# Patient Record
Sex: Male | Born: 1945 | Race: White | Hispanic: No | Smoking: Former smoker
Health system: Western US, Academic
[De-identification: ages and names within clinical notes are randomized; demographics above are authoritative.]

## PROBLEM LIST (undated history)

## (undated) HISTORY — PX: COLONOSCOPY: GILAB00002

## (undated) HISTORY — PX: LAP UMBILICAL HERNIA REPAIR: S2076

## (undated) HISTORY — PX: THYROIDECTOMY, SUBTOTAL: SHX002597

## (undated) HISTORY — PX: REPAIR, HERNIA, INGUINAL, LAPAROSCOPIC: SHX001562

## (undated) HISTORY — PX: PR PARTIAL HIP REPLACEMENT: 27125

## (undated) HISTORY — PX: PR REMV CATARACT EXTRACAP,INSERT LENS,COMP: 66982

---

## 2014-11-19 ENCOUNTER — Ambulatory Visit: Payer: Medicare Other | Admitting: Internal Medicine

## 2014-11-19 VITALS — BP 130/90 | HR 68 | Temp 96.6°F | Ht 68.0 in | Wt 236.1 lb

## 2014-11-19 DIAGNOSIS — J301 Allergic rhinitis due to pollen: Secondary | ICD-10-CM

## 2014-11-19 DIAGNOSIS — R1310 Dysphagia, unspecified: Secondary | ICD-10-CM

## 2014-11-19 DIAGNOSIS — N400 Enlarged prostate without lower urinary tract symptoms: Secondary | ICD-10-CM | POA: Insufficient documentation

## 2014-11-19 DIAGNOSIS — C439 Malignant melanoma of skin, unspecified: Secondary | ICD-10-CM

## 2014-11-19 DIAGNOSIS — I10 Essential (primary) hypertension: Secondary | ICD-10-CM | POA: Insufficient documentation

## 2014-11-19 MED ORDER — LISINOPRIL 20 MG TABLET
20.0000 mg | ORAL_TABLET | Freq: Every day | ORAL | Status: DC
Start: 2014-11-19 — End: 2014-12-30

## 2014-11-19 NOTE — Nursing Note (Signed)
Patient identity confirmed using two identifiers.Vital signs taken, allergies verified, screened for pain.  Jmari Pelc, MA

## 2014-11-19 NOTE — Progress Notes (Signed)
Russell Freeman is a 69yr old male  Chief Complaint   Patient presents with    Establish Care    Other     swolling issue -food    Medication Follow Up     review     Here to establish.   He lives most of the year in [place] and had a full PE in February and will do a Screening Colonoscopy later this year.     He is here to follow up hypertension.  Hypertension ROS: taking medications as instructed, no medication side effects noted, patient does not perform home BP monitoring, no TIA's, no chest pain on exertion, no dyspnea on exertion, no swelling of ankles.   New concerns: none.     Having bad allergy symptoms. These include coryza, sneezing, nasal blockage and post nasal drip.  Has tried Ad[employer] account planner.  has not been tested for allergies.  Is allergic to pollen.     He complains of urinary symptoms - weak stream. Social history: no or minimal alcohol, nonsmoker. Past GU history is negative for BPH, UTI, stones or other renal diseases.     He complains of gastrointestinal symptoms of difficulty swallowing for 2 month. Feels like getting stuck mid chest region. No blood in stool. He denies watery diarrhea, abdominal pain, nausea, vomiting, anorexia, weight loss. Mainly solid food.   It does hurt to swallow especially when it gets stuck.  He denies constitutional symptoms of fatigue, weakness, weight loss or gain, fevers, night sweats. No unexplained weight loss. No heartburn or GERD symptoms.   Smoking history former smoker    Has some skin lesions that need to be checked.     History   Substance Use Topics    Smoking status: Not on file    Smokeless tobacco: Not on file    Alcohol Use: Not on file      OBJECTIVE:  BP 130/90 mmHg  Pulse 68  Temp(Src) 35.9 C (96.6 F) (Tympanic)  Ht 1.727 m (5\' 8" )  Wt 107.094 kg (236 lb 1.6 oz)  BMI 35.91 kg/m2  Appearance - in no apparent distress, well developed and well nourished, non-toxic, in no respiratory distress and acyanotic, alert, oriented times 3, well  groomed and dressed, normal vitals and well hydrated.  Chest Exam - Chest is clear, no wheezing, rhonchi or rales. Normal symmetric air entry throughout both lung fields. No chest wall deformities or tenderness.  Cardiac Exam - S1 and S2 normal, no murmurs, clicks, gallops or rubs. Regular rate and rhythm. Normal PMI. No edema or JVD.   Abdomen - soft without tenderness, guarding, mass, rebound or organomegaly. Bowel sounds are normal. No CVA tenderness or inguinal adenopathy noted.  Extremities - extremities, peripheral pulses and reflexes normal, no edema, redness or tenderness in the calves or thighs, no ulcers, gangrene or trophic changes, Homan's sign is negative, no sign of DVT, distal neurovascular exam intact.     IMPRESSION and PLAN:  (I10) Essential hypertension  (primary encounter diagnosis)  Comment: uncontrolled   Plan: Lisinopril (PRINIVIL, ZESTRIL) 20 mg Tablet,         DISCONTINUED: Lisinopril (PRINIVIL, ZESTRIL) 10        mg Tablet        Discussed sodium restriction, maintaining ideal body weight and regular exercise program as physiologic means to achieve blood pressure control. The patient will strive towards this. Meanwhile, it is appropriate to lower BP with medications, while observing for therapeutic effect and if appropriate  later, can discontinue medications if physiologic methods appear to be effective. The patient indicates understanding of these issues and agrees with the plan. The various types of antihypertensives are discussed fully. See medication orders in EpicCare. Side effects explained in detail. Continue home readings and see me for followup as scheduled. Low dose aspirin daily advised.      (N40.0) BPH (benign prostatic hyperplasia)  Comment: continue present medication(s)   Plan: Finasteride (PROSCAR) 5 mg Tablet, Tamsulosin         (FLOMAX) 0.4 mg Capsule        Call or return to clinic prn if these symptoms worsen or fail to improve as anticipated.     (J30.1) Allergic  rhinitis due to pollen  Comment: well controlled   Plan: Fluticasone (FLONASE) 50 mcg/actuation nasal         spray, Fexofenadine (ALLEGRA) 180 mg tablet        continue present medication(s) The causes and treatment of seasonal allergic rhinitis are discussed in detail. Allergen avoidance, use and side effects of OTC and prescription antihistamine decongestant products, and the use and side effects of inhaled nasal corticosteroids is reviewed. Allergy desensitization shots are reserved for severe and refractory cases.     (R13.10) Dysphagia  Comment: Needs work up   Plan: [place], [place]        Call or return to clinic prn if these symptoms worsen or fail to improve as anticipated. The patient is asked to make an attempt to improve diet and exercise patterns to aid in medical management of this problem.     (C43.9) Melanoma of skin  Comment: history and needs some moles checked.   Plan: DERMATOLOGY CLINIC REFERRAL          FOLLOW UP 1 month  Total time of visit 40 minutes greater than 50% of time was spent counseling about:  Essential hypertension  (primary encounter diagnosis)  BPH (benign prostatic hyperplasia)  Allergic rhinitis due to pollen  Dysphagia  Melanoma of skin.   The patient verbalizes understanding and agrees with the plan of care.   Patient does not have access to My Chart.  History reviewed and updated.  Barriers to Learning assessed: none. Patient verbalizes understanding of teaching and instructions. An after visit summary was provided to the patient. The patient was instructed in all aspects of care and expressed comprehension.   Education given to the patient regarding diagnosis and follow-up.     Electronically signed by:  Bethena Midget, M.D.  [place]

## 2014-11-24 ENCOUNTER — Ambulatory Visit
Admission: RE | Admit: 2014-11-24 | Discharge: 2014-11-24 | Disposition: A | Discharge status: Home / Self Care | Payer: Medicare Other | Source: Ambulatory Visit | Attending: Internal Medicine | Admitting: Internal Medicine

## 2014-11-24 DIAGNOSIS — R1310 Dysphagia, unspecified: Principal | ICD-10-CM | POA: Insufficient documentation

## 2014-11-24 DIAGNOSIS — K219 Gastro-esophageal reflux disease without esophagitis: Secondary | ICD-10-CM

## 2014-11-24 DIAGNOSIS — K449 Diaphragmatic hernia without obstruction or gangrene: Secondary | ICD-10-CM

## 2014-11-24 MED ORDER — BARIUM SULFATE 98 % ORAL POWDER FOR SUSPENSION
60.0000 mL | INHALATION_SUSPENSION | ORAL | Status: AC
Start: 2014-11-24 — End: 2014-11-24
  Administered 2014-11-24: 60 mL via ORAL

## 2014-11-24 MED ORDER — BARIUM SULFATE 40 % (W/V), 30 % (W/W) ORAL SUSPENSION
60.0000 mL | ORAL | Status: AC
Start: 2014-11-24 — End: 2014-11-24
  Administered 2014-11-24: 60 mL via ORAL

## 2014-12-04 ENCOUNTER — Telehealth: Payer: Self-pay | Admitting: Internal Medicine

## 2014-12-04 MED ORDER — PANTOPRAZOLE 40 MG TABLET,DELAYED RELEASE
40.0000 mg | DELAYED_RELEASE_TABLET | Freq: Every day | ORAL | Status: DC
Start: 2014-12-04 — End: 2014-12-30

## 2014-12-04 NOTE — Telephone Encounter (Signed)
Spoke with patient. The GI clinic is unable to schedule him in til June 9th. He is going back [place] on June 8 and seeing Dr. Molly Maduro, gastroenterologist (pt seen this doctor in the past).

## 2014-12-04 NOTE — Telephone Encounter (Signed)
It showed he has acid reflux  I am going to send in some medication for this once a day  He can schedule with GI Medicine  Thanks

## 2014-12-04 NOTE — Telephone Encounter (Signed)
Patient is calling for the results of the esophogram done 11/24/14.     He is also calling the Gastroenterology clinic to schedule his appointment there.     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

## 2014-12-17 ENCOUNTER — Ambulatory Visit: Payer: Medicare Other | Admitting: Internal Medicine

## 2014-12-17 ENCOUNTER — Encounter: Payer: Self-pay | Admitting: Internal Medicine

## 2014-12-17 VITALS — BP 130/88 | HR 72 | Temp 96.6°F | Wt 243.7 lb

## 2014-12-17 DIAGNOSIS — I10 Essential (primary) hypertension: Principal | ICD-10-CM

## 2014-12-17 DIAGNOSIS — K219 Gastro-esophageal reflux disease without esophagitis: Secondary | ICD-10-CM | POA: Insufficient documentation

## 2014-12-17 DIAGNOSIS — R635 Abnormal weight gain: Secondary | ICD-10-CM

## 2014-12-17 MED ORDER — AMLODIPINE 5 MG TABLET
5.0000 mg | ORAL_TABLET | Freq: Every day | ORAL | Status: DC
Start: 2014-12-17 — End: 2015-01-22

## 2014-12-17 NOTE — Nursing Note (Signed)
Patient identity confirmed using two identifiers.Vital signs taken, allergies verified, screened for pain.  Menna Abeln, MA

## 2014-12-17 NOTE — Progress Notes (Signed)
Russell Freeman is a 69yr old male    Chief Complaint   Patient presents with    Blood Pressure     Patient Active Problem List   Diagnosis    BPH (benign prostatic hyperplasia)    Essential hypertension      He is here to follow up hypertension.  Hypertension ROS: taking medications as instructed, no medication side effects noted, home BP monitoring in range of 960'A systolic over 54'U diastolic, no TIA's, no chest pain on exertion, no dyspnea on exertion, no swelling of ankles.   New concerns: none. Feels no change 133/104 this morning.      \Continues to gain weight.   He is here to discuss weight gain. He has gained 20 pounds over 1-2 years year(s). Has had thyroid function studies. Feels weight gain is related to lack of activity and knee pain.       He complains of gastrointestinal symptoms of difficulty swallowing for 2 month. Feels like getting stuck mid chest region. No blood in stool. He denies watery diarrhea, abdominal pain, nausea, vomiting, anorexia, weight loss. Mainly solid food.   It does hurt to swallow especially when it gets stuck.  He denies constitutional symptoms of fatigue, weakness, weight loss or gain, fevers, night sweats. No unexplained weight loss. No heartburn or GERD symptoms.   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    IMPRESSION:  1. SMALL HIATAL HERNIA.  2. GASTROESOPHAGEAL REFLUX.    History   Substance Use Topics    Smoking status: Never Smoker     Smokeless tobacco: Not on file    Alcohol Use: No       OBJECTIVE:  BP 130/88 mmHg  Pulse 72  Temp(Src) 35.9 C (96.6 F) (Tympanic)  Wt 110.542 kg (243 lb 11.2 oz)  Appearance - in no apparent distress, well developed and well nourished, non-toxic, in no respiratory distress and acyanotic, alert, oriented times 3, well groomed and dressed, normal vitals and well hydrated.   Mental status exam; he is alert, orient to time, person and place. Normal thought content, speech, affect, mood and dress  are noted.   Extremities - extremities, peripheral pulses and reflexes normal, no edema, redness or tenderness in the calves or thighs, no ulcers, gangrene or trophic changes, Homan's sign is negative, no sign of DVT, distal neurovascular exam intact.  Stasis dermatitis    IMPRESSION and PLAN:  (I10) Essential hypertension  (primary encounter diagnosis)  Comment: borderline control   Plan: LIPID PANEL WITH DLDL REFLEX, COMPREHENSIVE         METABOLIC PANEL, CBC NO DIFFERENTIAL,         URINALYSIS AND CULTURE IF IND, Amlodipine         (NORVASC) 5 mg Tablet        Increase Lisinopril to 40 mg if BP still running high in 2 weeks add Norvasc Discussed sodium restriction, maintaining ideal body weight and regular exercise program as physiologic means to achieve blood pressure control. The patient will strive towards this. Meanwhile, it is appropriate to lower BP with medications, while observing for therapeutic effect and if appropriate later, can discontinue medications if physiologic methods appear to be effective. The patient indicates understanding of these issues and agrees with the plan. The various types of antihypertensives are discussed fully. See medication orders in EpicCare. Side effects explained in detail. Continue home readings and see me for followup as scheduled. Low dose aspirin daily advised.      (  K21.9) Gastroesophageal reflux disease without esophagitis  Comment: well controlled   Plan: continue present medication(s) Call or return to clinic prn if these symptoms worsen or fail to improve as anticipated.     (R63.5) Weight gain  Comment: I had a lengthy discussion with the patient regarding the various weight loss programs, including our classes, dietician, Weight Watchers, [facility] and others and the commitment and desire necessary for success. The patient will consider these options. We also discussed the importane of cardio vascular exercise in achieving weight loss goals.     FOLLOW UP 1  month  Total time of visit 25 minutes greater than 50% of time was spent counseling about:  Essential hypertension  (primary encounter diagnosis)  Gastroesophageal reflux disease without esophagitis  Weight gain.   The patient verbalizes understanding and agrees with the plan of care.   Patient does not have access to My Chart.  History reviewed and updated.  Barriers to Learning assessed: none. Patient verbalizes understanding of teaching and instructions. An after visit summary was provided to the patient. The patient was instructed in all aspects of care and expressed comprehension.   Education given to the patient regarding diagnosis and follow-up.     Electronically signed by:  Bethena Midget, M.D.  [place]

## 2014-12-17 NOTE — Patient Instructions (Signed)
Increase Lisinopril to 40 mg daily.  If BP still high after 2 weeks then add Amlodipine.

## 2014-12-19 ENCOUNTER — Ambulatory Visit: Payer: Medicare Other | Admitting: Internal Medicine

## 2014-12-19 VITALS — BP 110/78 | HR 96 | Temp 97.5°F | Wt 244.4 lb

## 2014-12-19 DIAGNOSIS — J208 Acute bronchitis due to other specified organisms: Principal | ICD-10-CM

## 2014-12-19 MED ORDER — AZITHROMYCIN 250 MG TABLET
ORAL_TABLET | ORAL | Status: AC
Start: 2014-12-19 — End: 2014-12-24

## 2014-12-19 MED ORDER — CODEINE 10 MG-GUAIFENESIN 100 MG/5 ML ORAL LIQUID
10.0000 mL | ORAL | Status: AC | PRN
Start: 2014-12-19 — End: 2015-01-18

## 2014-12-19 NOTE — Nursing Note (Signed)
Patient identity confirmed using two identifiers.Vital signs taken, allergies verified, screened for pain.  Lark Runk, MA

## 2014-12-19 NOTE — Progress Notes (Signed)
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 Now the last two days body aches and sore throat. Chest congestion.   He complains of sinus and nasal congestion, sore throat, nasal blockage, post nasal drip, chest congestion, productive cough and cough described as productive of yellow and green sputum for several days. Denies a history of sweats, chills, fevers, myalgias, wheezing, shortness of breath, chest pain, weakness, fatigue, dizziness, nausea and vomiting. Denies a history of asthma. He patient does not smoke cigarettes.   OTC Medications tried: none    History   Substance Use Topics    Smoking status: Never Smoker     Smokeless tobacco: Not on file    Alcohol Use: No         OBJECTIVE:  BP 110/78 mmHg  Pulse 96  Temp(Src) 36.4 C (97.5 F) (Tympanic)  Wt 110.859 kg (244 lb 6.4 oz)  SpO2 93%   Appearance - in no apparent distress, well developed and well nourished, non-toxic, in no respiratory distress and acyanotic, alert, oriented times 3, well groomed and dressed, normal vitals and well hydrated.  Ear exam - both sides normal, TM intact without perforation or effusion, external canal normal. No significant cerumenosis noted.  Nasal exam - septum midline, no deformities, nares patent, normal mucosa without swelling, no polyps, no bleeding.  Throat - Oral cavity, tongue, pharynx and palate have no inflammation or suspicious lesions. No erythema or exudates. Uvula midline. Tonsils - tonsils are present and normal.  Sinuses - Paranasal sinuses are normal. No tenderness to the maxillary, frontal, ethmoids or mastoids.  Neck - neck  is supple and free of adenopathy or masses, the thyroid is normal without enlargement or nodules. No JVD. Carotids 2+ without bruits.   Chest Exam - Chest is clear, no wheezing, rhonchi or rales. Normal symmetric air entry throughout both lung fields. No chest wall deformities or tenderness.    IMPRESSION and PLAN:  (J20.9) Acute bronchitis  (primary encounter diagnosis)  Comment: bronchitis  Symptomatic therapy suggested: push fluids, rest and ROV prn if symptoms persist or worsen. Call or return to clinic prn if these symptoms worsen or fail to improve as anticipated.  To the ER with any increased cough, chest pain, dyspnea, wheezing or hemoptysis. Or fever, chills or night sweats.  Allergies to antibiotics were reviewed. The common side-effects of antibiotics were discussed. GI symptoms can occur. Watch for rash or hives or sign off allergic reaction and discontinue and seek medical care immediately.  Chest xray if no better in 2-3 days.   Plan: antibiotics as ordered          FOLLOW UP prn  Total time spent with the patient 25 minutes.  History reviewed and updated.  Barriers to Learning assessed: none. Patient verbalizes understanding of teaching and instructions. An after visit summary was provided to the patient. The patient was instructed in all aspects of care and expressed comprehension.   Education given to the patient regarding diagnosis and  follow-up.     Electronically signed by:  Bethena Midget, M.D.  [place]

## 2014-12-30 ENCOUNTER — Telehealth: Payer: Self-pay | Admitting: Internal Medicine

## 2014-12-30 MED ORDER — PANTOPRAZOLE 40 MG TABLET,DELAYED RELEASE
40.0000 mg | DELAYED_RELEASE_TABLET | Freq: Every day | ORAL | Status: DC
Start: 2014-12-30 — End: 2016-03-07

## 2014-12-30 MED ORDER — LISINOPRIL 20 MG TABLET
20.0000 mg | ORAL_TABLET | Freq: Two times a day (BID) | ORAL | Status: DC
Start: 2014-12-30 — End: 2015-09-14

## 2014-12-30 NOTE — Telephone Encounter (Signed)
Received refill fax from [facility] regarding Lisinopril 20mg  tablet stating attn: Dr. Redmond Pulling, pt states he is supposed to be on 40mg . Can we change? Also pt request 90days on all his med, can we get in 90 days?    On Pantoprazole Sod DR 40mg  tab states can we get this in 90 days per insurance?   Please advise.

## 2014-12-30 NOTE — Telephone Encounter (Signed)
Done

## 2015-01-22 ENCOUNTER — Ambulatory Visit: Payer: Medicare Other | Admitting: Internal Medicine

## 2015-01-22 VITALS — BP 130/88 | HR 72 | Temp 96.4°F | Wt 243.5 lb

## 2015-01-22 DIAGNOSIS — I10 Essential (primary) hypertension: Principal | ICD-10-CM

## 2015-01-22 NOTE — Nursing Note (Signed)
Patient identity confirmed using two identifiers.Vital signs taken, allergies verified, screened for pain.  Gianina Olinde, MA

## 2015-01-22 NOTE — Progress Notes (Signed)
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  Hypertension ROS: taking medications as instructed, no medication side effects noted, home BP monitoring in range of 469-629'B systolic over 28-41'L diastolic, no TIA's, no chest pain on exertion, no dyspnea on exertion, no swelling of ankles.   New concerns: he made some changes in his diet. He feels the increase from 20 mg to 40 mg is too much. He was fine on the 20 mg. Never took the Amlodipine.      He hasn't been taking his BP medication and feels it was making his dizzy a liitle bit when he stands up too fast. Light headed and fatigued.     History   Substance Use Topics    Smoking status: Never Smoker     Smokeless tobacco: Not on file    Alcohol Use: No      OBJECTIVE:  BP 130/88 mmHg  Pulse 72  Temp(Src) 35.8 C (96.4 F) (Tympanic)  Wt 110.451 kg (243 lb 8 oz)  Appearance - in no apparent distress, well developed and well nourished, non-toxic, in no respiratory distress and acyanotic, alert, oriented times 3, well groomed and dressed, normal vitals and well hydrated.  Mental status exam; he is alert, orient to time, person and place. Normal thought content, speech, affect, mood and dress are noted.   Cardiac Exam - S1 and S2 normal, no murmurs, clicks, gallops or rubs. Regular rate and rhythm. Normal PMI. No edema or JVD.     IMPRESSION and PLAN:  (I10) Essential hypertension  (primary encounter diagnosis)  Comment: low readings at home. Decrease back down to 20 mg and monitor BP.   Plan: Discussed sodium restriction, maintaining ideal body weight and regular exercise program as physiologic means to achieve blood pressure control. The patient will strive towards this. Meanwhile, it is  appropriate to lower BP with medications, while observing for therapeutic effect and if appropriate later, can discontinue medications if physiologic methods appear to be effective. The patient indicates understanding of these issues and agrees with the plan. The various types of antihypertensives are discussed fully. See medication orders in EpicCare. Side effects explained in detail. Continue home readings and see me for followup as scheduled. Low dose aspirin daily advised.      FOLLOW UP 1 month  Total time of visit 15 minutes greater than 50% of time was spent counseling about:  Essential hypertension  (primary encounter diagnosis).   The patient verbalizes understanding and agrees with the plan of care.   Patient does not have access to My Chart.  History reviewed and updated.  Barriers to Learning assessed: none. Patient verbalizes understanding of teaching and instructions. An after visit summary was provided to the patient. The patient was instructed in all aspects of care and expressed comprehension.   Education given to the patient regarding diagnosis and follow-up.     Electronically signed by:  Bethena Midget, M.D.  [place]

## 2015-07-01 ENCOUNTER — Ambulatory Visit: Payer: Medicare Other

## 2015-07-01 DIAGNOSIS — Z23 Encounter for immunization: Principal | ICD-10-CM

## 2015-07-01 NOTE — Nursing Note (Signed)
The Influenza Vaccine VIS document for the flu injection was given to patient to review. Patient or person named in permission has answered no to any history of egg allergy, previous serious reaction to a influenza vaccine or current illness which would preclude them receiving an immunization. Any questions were referred to the physician. The Influenza Vaccine was then administered per protocol. The patient was observed for immediate reactions to the vaccine per protocol. None were observed.   Matthew Cina, lvn

## 2015-09-13 ENCOUNTER — Other Ambulatory Visit: Payer: Self-pay | Admitting: Internal Medicine

## 2015-09-13 DIAGNOSIS — J208 Acute bronchitis due to other specified organisms: Principal | ICD-10-CM

## 2015-09-14 ENCOUNTER — Ambulatory Visit: Payer: Medicare Other | Admitting: Internal Medicine

## 2015-09-14 ENCOUNTER — Encounter: Payer: Self-pay | Admitting: Internal Medicine

## 2015-09-14 VITALS — BP 114/74 | HR 94 | Temp 97.8°F | Wt 249.3 lb

## 2015-09-14 DIAGNOSIS — J00 Acute nasopharyngitis [common cold]: Principal | ICD-10-CM

## 2015-09-14 DIAGNOSIS — I10 Essential (primary) hypertension: Secondary | ICD-10-CM

## 2015-09-14 MED ORDER — LISINOPRIL 10 MG TABLET
10.0000 mg | ORAL_TABLET | Freq: Two times a day (BID) | ORAL | 5 refills | Status: DC
Start: 2015-09-14 — End: 2015-09-14

## 2015-09-14 MED ORDER — CODEINE 10 MG-GUAIFENESIN 100 MG/5 ML ORAL LIQUID
5.0000 mL | ORAL | 0 refills | Status: DC | PRN
Start: 2015-09-14 — End: 2016-10-03

## 2015-09-14 MED ORDER — LISINOPRIL 10 MG TABLET
10.0000 mg | ORAL_TABLET | Freq: Two times a day (BID) | ORAL | 3 refills | Status: DC
Start: 2015-09-14 — End: 2016-03-30

## 2015-09-14 NOTE — Progress Notes (Signed)
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   He complains of sinus and nasal congestion, sore throat, nasal blockage, post nasal drip, chest congestion, productive cough and cough described as productive of clear sputum for several days. Denies a history of sweats, chills, fevers, myalgias, wheezing, shortness of breath, chest pain, weakness, fatigue, dizziness, nausea and vomiting. Denies a history of asthma. He patient does not smoke cigarettes.   OTC Medications tried: Allegra    He is here to follow up hypertension.  Hypertension ROS: taking medications as instructed, no medication side effects noted, home BP monitoring in range of [id number] systolic over [id number] diastolic, no TIA's, no chest pain on exertion, no dyspnea on exertion, no swelling of ankles.   New concerns: none. He is taking half a Lisinopril bid    Social History   Substance Use Topics    Smoking status: Never Smoker    Smokeless tobacco: Not on file    Alcohol use No         OBJECTIVE:  BP 114/74  Pulse 94  Temp 36.6 C (97.8 F) (Temporal)  Wt 113.1 kg (249 lb 5.4 oz)  SpO2 95%  BMI 37.91 kg/m2   Appearance - in no apparent distress, well developed and well nourished, non-toxic, in no respiratory distress and acyanotic, alert, oriented times 3, well groomed and dressed, normal vitals and well hydrated.  Ear exam - both sides normal, TM intact without perforation or effusion, external canal normal. No significant cerumenosis noted.  Nasal exam - septum midline, no deformities, nares patent, normal mucosa without swelling, no polyps, no bleeding.  Throat - Oral cavity, tongue, pharynx  and palate have no inflammation or suspicious lesions. No erythema or exudates. Uvula midline. Tonsils - tonsils are present and normal.  Sinuses - Paranasal sinuses are normal. No tenderness to the maxillary, frontal, ethmoids or mastoids.  Neck - neck is supple and free of adenopathy or masses, the thyroid is normal without enlargement or nodules. No JVD. Carotids 2+ without bruits.   Chest Exam - Chest is clear, no wheezing, rhonchi or rales. Normal symmetric air entry throughout both lung fields. No chest wall deformities or tenderness.    IMPRESSION and PLAN:  (J20.9) Acute bronchitis  (primary encounter diagnosis)  Comment: bronchitis  Symptomatic therapy suggested: push fluids, rest and ROV prn if symptoms persist or worsen. Call or return to clinic prn if these symptoms worsen or fail to improve as anticipated.  To the ER with any increased cough, chest pain, dyspnea, wheezing or hemoptysis. Or fever, chills or night sweats.  Chest xray if no better in 2-3 days.     (I10) Essential hypertension  Comment: well controlled   Plan: Discussed sodium restriction, maintaining ideal body weight and regular exercise program as physiologic means to achieve blood pressure control. The patient will strive towards this. Meanwhile, it is appropriate to lower BP with medications, while observing for therapeutic effect and if appropriate later,  can discontinue medications if physiologic methods appear to be effective. The patient indicates understanding of these issues and agrees with the plan. The various types of antihypertensives are discussed fully. See medication orders in EpicCare. Side effects explained in detail. Continue home readings and see me for followup as scheduled. Low dose aspirin daily advised.  He had labs last June in [place] and everything OK    FOLLOW UP 6 months  Total time spent with the patient 25 minutes.  History reviewed and updated.  Barriers to Learning assessed: none. Patient verbalizes  understanding of teaching and instructions. An after visit summary was provided to the patient. The patient was instructed in all aspects of care and expressed comprehension.   Education given to the patient regarding diagnosis and follow-up.     Electronically signed by:  Bethena Midget, M.D.  [place]

## 2015-09-14 NOTE — Nursing Note (Signed)
Patient identity confirmed using two identifiers.Vital signs taken, allergies verified, screened for pain.  Paralee Pendergrass, MA

## 2016-03-07 ENCOUNTER — Encounter: Payer: Self-pay | Admitting: Internal Medicine

## 2016-03-07 MED ORDER — FINASTERIDE 5 MG TABLET
5.0000 mg | ORAL_TABLET | Freq: Every day | ORAL | 2 refills | Status: DC
Start: 2016-03-07 — End: 2016-11-02

## 2016-03-07 MED ORDER — PANTOPRAZOLE 40 MG TABLET,DELAYED RELEASE
40.0000 mg | DELAYED_RELEASE_TABLET | Freq: Every day | ORAL | 2 refills | Status: DC
Start: 2016-03-07 — End: 2016-11-24

## 2016-03-07 MED ORDER — TAMSULOSIN 0.4 MG CAPSULE
0.4000 mg | ORAL_CAPSULE | Freq: Every day | ORAL | 2 refills | Status: DC
Start: 2016-03-07 — End: 2016-04-11

## 2016-03-07 NOTE — Telephone Encounter (Signed)
From: Elwyn Reach  To: Tito Dine, MD  Sent: 03/05/2016 11:33 AM PDT  Subject: MyChart Refill Request    I need a refill on Pantoprazole sod dr 40 mg tab. Dr. Redmond Pulling wrote the perscription on 12/30/2014. Iget 90 pills. Also on my tamulosin and finesteride I am supposed to get 90 pills each, but on my chart it still has only 30 pills. Thank you

## 2016-03-30 ENCOUNTER — Encounter: Payer: Self-pay | Admitting: Internal Medicine

## 2016-03-30 ENCOUNTER — Ambulatory Visit
Admission: RE | Admit: 2016-03-30 | Discharge: 2016-03-30 | Disposition: A | Discharge status: Home / Self Care | Payer: Medicare Other | Source: Ambulatory Visit | Attending: Internal Medicine | Admitting: Internal Medicine

## 2016-03-30 ENCOUNTER — Ambulatory Visit (INDEPENDENT_AMBULATORY_CARE_PROVIDER_SITE_OTHER): Payer: Medicare Other | Admitting: Internal Medicine

## 2016-03-30 ENCOUNTER — Other Ambulatory Visit: Payer: Self-pay | Admitting: Internal Medicine

## 2016-03-30 VITALS — BP 134/82 | HR 64 | Temp 96.6°F | Wt 252.0 lb

## 2016-03-30 DIAGNOSIS — M25562 Pain in left knee: Principal | ICD-10-CM

## 2016-03-30 DIAGNOSIS — I10 Essential (primary) hypertension: Principal | ICD-10-CM

## 2016-03-30 DIAGNOSIS — Z125 Encounter for screening for malignant neoplasm of prostate: Secondary | ICD-10-CM

## 2016-03-30 DIAGNOSIS — N400 Enlarged prostate without lower urinary tract symptoms: Secondary | ICD-10-CM

## 2016-03-30 DIAGNOSIS — K635 Polyp of colon: Secondary | ICD-10-CM

## 2016-03-30 DIAGNOSIS — M1712 Unilateral primary osteoarthritis, left knee: Secondary | ICD-10-CM

## 2016-03-30 NOTE — Nursing Note (Signed)
Patient identity confirmed using two identifiers.Vital signs taken, allergies verified, screened for pain.  Alixandria Friedt, MA

## 2016-03-30 NOTE — Patient Instructions (Signed)
Cut Lisinopril in half - One a day due to low Blood Pressure    Trial cut the Pantoprazole in half to see if that controls symptoms     Labs fasting     Schedule with   [facility]   Phone: [phone]  For your knee - xray today    Schedule with Urologist to check prostate and discuss medications  Dr. Richardine Service  Phone: [phone]

## 2016-03-30 NOTE — Progress Notes (Signed)
SUBJECTIVE:  Russell Freeman is a 70yr old male    Chief Complaint: Patient presents today with Adult Well Visit (Medicare) (?); Referral (colonoscopy); Medication Problem (BP medication); and Knee Problem (left)    Patient Active Problem List   Diagnosis    BPH (benign prostatic hyperplasia)    Essential hypertension    Gastroesophageal reflux disease without esophagitis      Current Outpatient Prescriptions on File Prior to Visit   Medication Sig Dispense Refill    Fexofenadine (ALLEGRA) 180 mg tablet Take 1 tablet by mouth every day. 30 tablet 6    Finasteride (PROSCAR) 5 mg Tablet Take 1 tablet by mouth every day. (prostate) 90 tablet 2    Fluticasone (FLONASE) 50 mcg/actuation nasal spray Instill 1-2 sprays into EACH nostril every day. (allergies) 16 g 5    Lisinopril (PRINIVIL, ZESTRIL) 10 mg Tablet Take 1 tablet by mouth 2 times daily. 180 tablet 3    Pantoprazole (PROTONIX) 40 mg Delayed Release Tablet Take 1 tablet by mouth every day. 90 tablet 2    Tamsulosin (FLOMAX) 0.4 mg Capsule Take 1 capsule by mouth every day at bedtime. (prostate) 90 capsule 2     No current facility-administered medications on file prior to visit.      He is here to follow up hypertension.  Hypertension ROS: taking medications as instructed, no medication side effects noted, no TIA's, no chest pain on exertion, no dyspnea on exertion, no swelling of ankles, does note some dizziness when arising.   New concerns: feels BP drops too low at times.     He complains of knees pain left side for several week(s).  Pain is described as aching. It is worse with certain activities especially walking. The patient has tried nothing for the pain. Symptoms have been gradually worsening over time.     He complains of urinary symptoms - urinary hesitancy for several years, gradual, worsening over time. ROS: patient denies dysuria, hematuria. Social history: no or minimal alcohol, nonsmoker, no or mild caffeine use, no ASA or NSAID's. Past GU  history is negative for BPH, UTI, stones or other renal diseases.     Family History:  his family history includes Hypertension in his father and mother.    Social History:  He  reports that he has never smoked. He has never used smokeless tobacco. He reports that he does not drink alcohol.     OBJECTIVE:   BP 134/82  Pulse 64  Temp (!) 35.9 C (96.6 F) (Temporal)  Wt 114.3 kg (251 lb 15.8 oz)  BMI 38.31 kg/m2   Appearance - in no apparent distress, well developed and well nourished, non-toxic, in no respiratory distress and acyanotic, alert, oriented times 3, well groomed and dressed, normal vitals and well hydrated.  Knee exam: left positive for moderate crepitations, some mild tenderness and pain on range of motion, no effusion is present, no pseudolaxity noted.   Mental status exam; he is alert, orient to time, person and place. Normal thought content, speech, affect, mood and dress are noted.     IMPRESSION and PLAN:  (I10) Essential hypertension  (primary encounter diagnosis)  Comment: well controlled   Plan: Lisinopril (PRINIVIL, ZESTRIL) 10 mg Tablet,         LIPID PANEL WITH DLDL REFLEX, COMPREHENSIVE         METABOLIC PANEL, CBC NO DIFFERENTIAL,         URINALYSIS AND CULTURE IF IND  Discussed sodium restriction, maintaining ideal body weight and regular exercise program as physiologic means to achieve blood pressure control. The patient will strive towards this. Meanwhile, it is appropriate to lower BP with medications, while observing for therapeutic effect and if appropriate later, can discontinue medications if physiologic methods appear to be effective. The patient indicates understanding of these issues and agrees with the plan. The various types of antihypertensives are discussed fully. See medication orders in EpicCare. Side effects explained in detail. Continue home readings and see me for followup as scheduled. Low dose aspirin daily advised.      (K63.5) Colon polyps  Comment:  due  Plan: GASTROENTEROLOGY REFERRAL          (M25.562) Acute pain of left knee  Comment: suspect OA   Plan: CANCELED: KNEE 3 VIEWS, LEFT        Suspect this is a flare up of arthritis due to degenerative changes and overuse/repetitive movements over the joint.   He was advised to     Take anti-inflammatory medicine, such as ibuprofen or Aleve. Advised that nonsteroidal anti-inflammatory medicines (NSAIDs) may cause stomach bleeding and other problems. These risks increase with age. Read the label and take as directed.   When the affected area, is painful, put an ice pack, gel pack, or package of frozen peas, wrapped in a cloth, on the area every 3 to 4 hours for up to 20 minutes at a time.    Apply moist heat to help a regain range of motion.    Do the exercises prescribed to keep the joint moving.    Get physical therapy if the above means don't improve the condition   Avoid activities that make the problem worse.   Consider Glucosamine supplements.   Will consider specialist evaluation or cortisone in one month if no improvement. Follow up one month.      (N40.0) Benign prostatic hyperplasia, presence of lower urinary tract symptoms unspecified, unspecified morphology  Comment: due  Plan: PSA SCREEN, [place] he see Urology     (Z12.5) Screening for prostate cancer  Comment: due  Plan: PSA SCREEN          FOLLOW UP 3 months  Total time of visit 30 minutes greater than 50% of time was spent counseling about:  Essential hypertension  (primary encounter diagnosis)  Colon polyps  Acute pain of left knee  Benign prostatic hyperplasia, presence of lower urinary tract symptoms unspecified, unspecified morphology  Screening for prostate cancer.   The patient verbalizes understanding and agrees with the plan of care.   Patient does have access to My Chart.  History reviewed and updated.  Barriers to Learning assessed: none. Patient verbalizes understanding of teaching and instructions.  An after visit summary was provided to the patient. The patient was instructed in all aspects of care and expressed comprehension.   Education given to the patient regarding diagnosis and follow-up.     Electronically signed by:  Bethena Midget, M.D.  [place]

## 2016-03-31 ENCOUNTER — Telehealth: Payer: Self-pay | Admitting: Gastroenterology

## 2016-04-01 ENCOUNTER — Ambulatory Visit: Payer: Medicare Other | Attending: Internal Medicine

## 2016-04-01 DIAGNOSIS — Z125 Encounter for screening for malignant neoplasm of prostate: Secondary | ICD-10-CM | POA: Insufficient documentation

## 2016-04-01 DIAGNOSIS — I10 Essential (primary) hypertension: Principal | ICD-10-CM | POA: Insufficient documentation

## 2016-04-01 DIAGNOSIS — N400 Enlarged prostate without lower urinary tract symptoms: Secondary | ICD-10-CM | POA: Insufficient documentation

## 2016-04-01 LAB — COMPREHENSIVE METABOLIC PANEL
ALANINE TRANSFERASE (ALT): 25 U/L (ref 6–63)
ALBUMIN: 3.9 g/dL (ref 3.2–4.6)
ALKALINE PHOSPHATASE (ALP): 84 U/L (ref 35–115)
ASPARTATE TRANSAMINASE (AST): 22 U/L (ref 15–43)
Bilirubin Total: 1.2 mg/dL (ref 0.3–1.3)
CALCIUM: 9.2 mg/dL (ref 8.6–10.5)
CARBON DIOXIDE TOTAL: 23 mmol/L — AB (ref 24–32)
CHLORIDE: 106 mmol/L (ref 95–110)
Creatinine Serum: 1.33 mg/dL — ABNORMAL HIGH (ref 0.44–1.27)
E-GFR, NON-AFRICAN AMERICAN: 55 mL/min/{1.73_m2}
GLUCOSE: 99 mg/dL (ref 70–99)
POTASSIUM: 4.4 mmol/L (ref 3.3–5.0)
PROTEIN: 6.4 g/dL (ref 6.3–8.3)
SODIUM: 138 mmol/L (ref 135–145)
UREA NITROGEN, BLOOD (BUN): 18 mg/dL (ref 8–22)

## 2016-04-01 LAB — CBC NO DIFFERENTIAL
HEMATOCRIT: 48.4 % (ref 41.0–53.0)
HEMOGLOBIN: 16.3 g/dL (ref 13.5–17.5)
MCH: 30.8 pg (ref 27.0–33.0)
MCHC: 33.7 % (ref 32.0–36.0)
MCV: 91.4 UM3 (ref 80.0–100.0)
MPV: 8.4 UM3 (ref 6.8–10.0)
PLATELET COUNT: 206 10*3/uL (ref 130–400)
RDW: 13.4 U (ref 0.0–14.7)
RED CELL COUNT: 5.29 10*6/uL (ref 4.50–5.90)
WHITE BLOOD CELL COUNT: 6.4 10*3/uL (ref 4.5–11.0)

## 2016-04-01 LAB — URINALYSIS AND CULTURE IF IND
BILIRUBIN URINE: NEGATIVE
GLUCOSE URINE: NEGATIVE mg/dL
KETONES: NEGATIVE mg/dL
LEUK. ESTERASE: NEGATIVE
NITRITE URINE: NEGATIVE
PH URINE: 6 (ref 4.8–7.8)
PROTEIN URINE: NEGATIVE mg/dL
RBC: 2 /[HPF] (ref 0–5)
SPECIFIC GRAVITY, URINE: 1.023 (ref 1.002–1.030)
UROBILINOGEN.: NEGATIVE mg/dL (ref ?–2.0)

## 2016-04-01 LAB — LIPID PANEL WITH DLDL REFLEX
CHOLESTEROL: 176 mg/dL (ref 0–200)
HDL CHOLESTEROL: 57 mg/dL (ref 35–?)
LDL Cholesterol Calculation: 111 mg/dL (ref <130)
Non-HDL Cholesterol: 119 mg/dL (ref <=160)
TOTAL CHOLESTEROL:HDL RATIO: 3.1 (ref <4.0)
TRIGLYCERIDE: 42 mg/dL (ref 35–160)

## 2016-04-01 LAB — PSA SCREEN: PSA SCREEN: 1.9 ng/mL (ref 0.0–4.0)

## 2016-04-01 MED ORDER — PEG 3350-ELECTROLYTES 236 GRAM-22.74 GRAM-6.74 GRAM-5.86 GRAM SOLUTION
ORAL | 0 refills | Status: DC
Start: 2016-04-01 — End: 2016-06-14

## 2016-04-01 NOTE — Telephone Encounter (Signed)
Patient scheduled for colonoscopy 06-14-16 with Doctor Mariella Saa.  Please send prescription for COLYTE to preferred pharmacy listed([facility] on [address])    [place]  [facility]  [phone]

## 2016-04-01 NOTE — Telephone Encounter (Signed)
colyte sent as requested.    Thanks,  Alyanah Elliott Reyes Sahand Gosch, MD

## 2016-04-11 ENCOUNTER — Encounter: Payer: Self-pay | Admitting: Urology

## 2016-04-11 ENCOUNTER — Ambulatory Visit: Payer: Medicare Other | Admitting: Urology

## 2016-04-11 VITALS — BP 128/76 | HR 81 | Temp 96.7°F | Ht 68.0 in | Wt 251.3 lb

## 2016-04-11 DIAGNOSIS — R350 Frequency of micturition: Secondary | ICD-10-CM

## 2016-04-11 DIAGNOSIS — R39198 Other difficulties with micturition: Secondary | ICD-10-CM

## 2016-04-11 DIAGNOSIS — N401 Enlarged prostate with lower urinary tract symptoms: Secondary | ICD-10-CM

## 2016-04-11 DIAGNOSIS — R399 Unspecified symptoms and signs involving the genitourinary system: Principal | ICD-10-CM

## 2016-04-11 DIAGNOSIS — R351 Nocturia: Secondary | ICD-10-CM

## 2016-04-11 MED ORDER — TAMSULOSIN 0.4 MG CAPSULE
0.8000 mg | ORAL_CAPSULE | Freq: Every day | ORAL | 2 refills | Status: DC
Start: 2016-04-11 — End: 2016-11-02

## 2016-04-11 NOTE — Communication Body (Signed)
Urology Clinic History and Physical  Date of Service: 04/11/2016  Patients Name: Russell Freeman  MRN: [id number]  DOB: [birth year]      CC: BPH - discussion of LUTS and alternatives    HPI:  70yr old male with history of LUTS.  Primarily, slow stream, frequency, and incomplete emptying.  Nocturia x2.  Currently on Flomax 0.4 mg and finasteride 5 mg.  Interested in other options.   He has been on medications for many years.  Notices some improvement with medications, but now stream slowing down again.  Denies side effects with medications.  Denies hematuria or prior history of stones.      AUA SS: 18    Home Medications  Current Outpatient Prescriptions on File Prior to Visit   Medication Sig Dispense Refill    Fexofenadine (ALLEGRA) 180 mg tablet Take 1 tablet by mouth every day. 30 tablet 6    Finasteride (PROSCAR) 5 mg Tablet Take 1 tablet by mouth every day. (prostate) 90 tablet 2    Fluticasone (FLONASE) 50 mcg/actuation nasal spray Instill 1-2 sprays into EACH nostril every day. (allergies) 16 g 5    Lisinopril (PRINIVIL, ZESTRIL) 10 mg Tablet Take 1 tablet by mouth every day. 90 tablet 3    Pantoprazole (PROTONIX) 40 mg Delayed Release Tablet Take 1 tablet by mouth every day. 90 tablet 2    PEG-Electrolyte Soln (COLYTE, GOLYTELY) 236-22.74-6.74 -5.86 gram Liquid Take as directed per [place] Instructions 1 bottle 0     No current facility-administered medications on file prior to visit.        Past Medical History  HTN  GERD    Past Surgical History  Past Surgical History:   Procedure Laterality Date    COLONOSCOPY  2012    Polyps done in [place] was told 5 years       Family History:  Family History   Problem Relation Age of Onset    Hypertension Mother     Hypertension Father      Social History:   Social History     Social History    Marital status: N/A     Spouse name: N/A    Number of children: N/A    Years of education: N/A     Occupational History    Not on file.     Social History Main Topics     Smoking status: Never Smoker    Smokeless tobacco: Never Used    Alcohol use No    Drug use: Not on file    Sexual activity: Not on file         Allergies    No Known Allergies    Review of Systems:  Constitutional: negative.  Eyes: negative.  Ears, Nose, Mouth, Throat: negative.  CV: negative.  Resp: negative.  GI: negative.  GU: slow stream  Musculoskeletal: negative.  Integumentary: negative.  Neuro: negative.  Psych: Mood pt's report, euthymic.  Endo: negative.  Heme/Lymphatic: negative.  Allergy/Immun: negative.        EXAM  GENERAL: NAD  HEENT: PERRL  LUNGS: CTAB  CV: RRR  ABD: Soft, non-tender, non-distended   VASCULAR: palpable pulses in upper and lower extremities bilaterally  MSK/EXT: moves all extremities  NEURO: AOx4  GU: Normal phallus.  testicles descended in normal lie,   DRE: 60 gm prostate, no nodules.   Skin: No bruising or scars    Labs:  PSA : 1.9 on finasteride    Assessment/Plan:  70yr old male with  LUTS, primarily obstructive symptoms.  Primary complaint is incomplete emptying.    We discussed options for management of bladder outlet obstruction.  Options include use of alpha-blockers, 5AR-inhibitors, or surgical options.  Regarding medical options, for alpha-blockers, we discussed risks of orthostatic hypotension and retrograde ejaculation.  For 5AR-inhibitors, we discussed that some men report decreased libido.  For surgical options we discussed options including uroLift, GLL, and TURP.  Additional options of simple prostatectomy and HoLEP were also discussed though I do not feel the patient's prostate size warrants these at this time. For uroLift we discussed data showing 80% success in stopping medications.  There is decreased risk of retrograde ejaculation compared to TURP.  If the patient is interested in uroLift we will need to perform a cystoscopy in our clinic.     All endoscopic procedures come with risk of bleeding, urethral stricture, failure to relieve obstruction, and  incontinence.      He is most interested in UroLift or TURP, but would like to postpone procedure until he has maximized his medications.      I doubled Flomax to 0.8 mg.  We will check in via myChart.  If symptoms with no improvement, then plan for cystoscopy at [facility] clinic.    Electronically 04/11/2016 signed  at 12:09 by:  Doree Fudge, MD, JD  Assistant Professor, Department of Urology  PI: [phone]

## 2016-04-11 NOTE — Progress Notes (Signed)
Urology Clinic History and Physical  Date of Service: 04/11/2016  Patients Name: Russell Freeman  MRN: [id number]  DOB: 1946-04-13      CC: BPH - discussion of LUTS and alternatives    HPI:  70yr old male with history of LUTS.  Primarily, slow stream, frequency, and incomplete emptying.  Nocturia x2.  Currently on Flomax 0.4 mg and finasteride 5 mg.  Interested in other options.   He has been on medications for many years.  Notices some improvement with medications, but now stream slowing down again.  Denies side effects with medications.  Denies hematuria or prior history of stones.      AUA SS: 18    Home Medications  Current Outpatient Prescriptions on File Prior to Visit   Medication Sig Dispense Refill    Fexofenadine (ALLEGRA) 180 mg tablet Take 1 tablet by mouth every day. 30 tablet 6    Finasteride (PROSCAR) 5 mg Tablet Take 1 tablet by mouth every day. (prostate) 90 tablet 2    Fluticasone (FLONASE) 50 mcg/actuation nasal spray Instill 1-2 sprays into EACH nostril every day. (allergies) 16 g 5    Lisinopril (PRINIVIL, ZESTRIL) 10 mg Tablet Take 1 tablet by mouth every day. 90 tablet 3    Pantoprazole (PROTONIX) 40 mg Delayed Release Tablet Take 1 tablet by mouth every day. 90 tablet 2    PEG-Electrolyte Soln (COLYTE, GOLYTELY) 236-22.74-6.74 -5.86 gram Liquid Take as directed per [place] Instructions 1 bottle 0     No current facility-administered medications on file prior to visit.        Past Medical History  HTN  GERD    Past Surgical History  Past Surgical History:   Procedure Laterality Date    COLONOSCOPY  2012    Polyps done in [place] was told 5 years       Family History:  Family History   Problem Relation Age of Onset    Hypertension Mother     Hypertension Father      Social History:   Social History     Social History    Marital status: N/A     Spouse name: N/A    Number of children: N/A    Years of education: N/A     Occupational History    Not on file.     Social History Main Topics     Smoking status: Never Smoker    Smokeless tobacco: Never Used    Alcohol use No    Drug use: Not on file    Sexual activity: Not on file         Allergies    No Known Allergies    Review of Systems:  Constitutional: negative.  Eyes: negative.  Ears, Nose, Mouth, Throat: negative.  CV: negative.  Resp: negative.  GI: negative.  GU: slow stream  Musculoskeletal: negative.  Integumentary: negative.  Neuro: negative.  Psych: Mood pt's report, euthymic.  Endo: negative.  Heme/Lymphatic: negative.  Allergy/Immun: negative.        EXAM  GENERAL: NAD  HEENT: PERRL  LUNGS: CTAB  CV: RRR  ABD: Soft, non-tender, non-distended   VASCULAR: palpable pulses in upper and lower extremities bilaterally  MSK/EXT: moves all extremities  NEURO: AOx4  GU: Normal phallus.  testicles descended in normal lie,   DRE: 60 gm prostate, no nodules.   Skin: No bruising or scars    Labs:  PSA : 1.9 on finasteride    Assessment/Plan:  70yr old male with  LUTS, primarily obstructive symptoms.  Primary complaint is incomplete emptying.    We discussed options for management of bladder outlet obstruction.  Options include use of alpha-blockers, 5AR-inhibitors, or surgical options.  Regarding medical options, for alpha-blockers, we discussed risks of orthostatic hypotension and retrograde ejaculation.  For 5AR-inhibitors, we discussed that some men report decreased libido.  For surgical options we discussed options including uroLift, GLL, and TURP.  Additional options of simple prostatectomy and HoLEP were also discussed though I do not feel the patient's prostate size warrants these at this time. For uroLift we discussed data showing 80% success in stopping medications.  There is decreased risk of retrograde ejaculation compared to TURP.  If the patient is interested in uroLift we will need to perform a cystoscopy in our clinic.     All endoscopic procedures come with risk of bleeding, urethral stricture, failure to relieve obstruction, and  incontinence.      He is most interested in UroLift or TURP, but would like to postpone procedure until he has maximized his medications.      I doubled Flomax to 0.8 mg.  We will check in via myChart.  If symptoms with no improvement, then plan for cystoscopy at [facility] clinic.    Electronically 04/11/2016 signed  at 12:09 by:  Doree Fudge, MD, JD  Assistant Professor, Department of Urology  PI: [phone]

## 2016-04-11 NOTE — Nursing Note (Signed)
Patient identified x 2, vitals taken, chief complaint identified, drug allergies verified, screened for pain,medications reviewed, pharmacy confirmed. Anaiz Qazi, LVN

## 2016-05-04 ENCOUNTER — Other Ambulatory Visit: Payer: Self-pay | Admitting: Internal Medicine

## 2016-05-04 MED ORDER — FLUTICASONE PROPIONATE 50 MCG/ACTUATION NASAL SPRAY,SUSPENSION
1.0000 | Freq: Every day | NASAL | 5 refills | Status: DC
Start: 2016-05-04 — End: 2016-10-26

## 2016-05-04 NOTE — Telephone Encounter (Signed)
Patient is requesting refill on Fluticasone (FLONASE) 50 mcg/actuation nasal spray and Fexofenadine (ALLEGRA) 180 mg tablet. Thank you.

## 2016-05-08 ENCOUNTER — Other Ambulatory Visit: Payer: Self-pay | Admitting: Internal Medicine

## 2016-05-08 DIAGNOSIS — J301 Allergic rhinitis due to pollen: Principal | ICD-10-CM

## 2016-05-24 ENCOUNTER — Encounter: Payer: Self-pay | Admitting: Gastroenterology

## 2016-05-24 NOTE — Telephone Encounter (Signed)
From: Elwyn Reach  Sent: 05/24/2016 10:55 AM PDT  Subject: [place] Advice Question    this question is for Dr. Mariella Saa. What are my instructions for beginning my colonoscopy prep for procedure to start on 7:15 am on October 24th 2017.

## 2016-06-08 NOTE — Telephone Encounter (Signed)
All questions answered

## 2016-06-08 NOTE — Telephone Encounter (Signed)
PAtient called and stated he had questions in regards to prep. Please contact patient at [phone]. Thank you

## 2016-06-09 ENCOUNTER — Telehealth: Payer: Self-pay | Admitting: Gastroenterology

## 2016-06-09 NOTE — Telephone Encounter (Signed)
Called to review colonoscopy instructions. Patient states he's ready and has no questions

## 2016-06-13 ENCOUNTER — Ambulatory Visit: Payer: Medicare Other | Admitting: Internal Medicine

## 2016-06-14 ENCOUNTER — Ambulatory Visit: Payer: Medicare Other | Attending: Gastroenterology | Admitting: Gastroenterology

## 2016-06-14 ENCOUNTER — Encounter: Payer: Self-pay | Admitting: Gastroenterology

## 2016-06-14 VITALS — BP 124/83 | HR 64 | Temp 96.1°F | Resp 14 | Ht 68.0 in | Wt 250.0 lb

## 2016-06-14 DIAGNOSIS — K648 Other hemorrhoids: Secondary | ICD-10-CM

## 2016-06-14 DIAGNOSIS — D122 Benign neoplasm of ascending colon: Secondary | ICD-10-CM

## 2016-06-14 DIAGNOSIS — Z86010 Personal history of colonic polyps: Secondary | ICD-10-CM | POA: Insufficient documentation

## 2016-06-14 DIAGNOSIS — Z1211 Encounter for screening for malignant neoplasm of colon: Secondary | ICD-10-CM | POA: Insufficient documentation

## 2016-06-14 DIAGNOSIS — K635 Polyp of colon: Principal | ICD-10-CM | POA: Insufficient documentation

## 2016-06-14 DIAGNOSIS — D126 Benign neoplasm of colon, unspecified: Secondary | ICD-10-CM

## 2016-06-14 DIAGNOSIS — D124 Benign neoplasm of descending colon: Secondary | ICD-10-CM | POA: Insufficient documentation

## 2016-06-14 DIAGNOSIS — K644 Residual hemorrhoidal skin tags: Secondary | ICD-10-CM | POA: Insufficient documentation

## 2016-06-14 DIAGNOSIS — K6389 Other specified diseases of intestine: Secondary | ICD-10-CM

## 2016-06-14 HISTORY — PX: HC COLONOSCOPY,BIOPSY: 750000036

## 2016-06-14 MED ORDER — PROMETHAZINE 25 MG/ML INJECTION SOLUTION
INTRAMUSCULAR | 0 refills | Status: AC
Start: 2016-06-14 — End: 2016-06-15

## 2016-06-14 MED ORDER — FENTANYL (PF) 50 MCG/ML INJECTION SOLUTION
INTRAMUSCULAR | 0 refills | Status: DC
Start: 2016-06-14 — End: 2016-06-14

## 2016-06-14 MED ORDER — MIDAZOLAM (PF) 1 MG/ML INJECTION SOLUTION
INTRAMUSCULAR | 0 refills | Status: DC
Start: 2016-06-14 — End: 2016-06-14

## 2016-06-14 MED ORDER — SODIUM CHLORIDE 0.9 % INTRAVENOUS SOLUTION
INTRAVENOUS | 0 refills | Status: DC
Start: 2016-06-14 — End: 2016-06-14

## 2016-06-14 MED ORDER — DIPHENHYDRAMINE 50 MG/ML INJECTION SOLUTION
INTRAMUSCULAR | 0 refills | Status: DC
Start: 2016-06-14 — End: 2016-06-14

## 2016-06-14 NOTE — Progress Notes (Signed)
Gastroenterology/hepatology Pre-procedure History & Physical   IMMEDIATE PRE-SEDATION ASSESSMENT                    Referring provider: Tito Dine, MD   Procedure: colonoscopy     Subjective:   HPI: Russell Freeman is a 70yr old male who presents for: screening colon; Colon 2005: tubular adenoma; colon 2008 nl; colon 2013  in [place], [place] and he had 2 polyps. No current bowel changes or symptoms.      [id number]: negative.  CV: negative.  Resp: negative.  GI: negative.  Musculoskeletal: negative.     I did review all available medical, surgical, personal/social history.   Patient Active Problem List   Diagnosis    BPH (benign prostatic hyperplasia)    Essential hypertension    Gastroesophageal reflux disease without esophagitis     Surg: bilat shoulder, partial thyroid, hernia ing and umbilical, retinal detachment,BCCa, knee right; fam hx: neg colon ca; soc; no tob, occas etoh    Physical exam:   General Appearance: healthy, alert, no distress, pleasant affect, cooperative.  Eyes:  conjunctivae and corneas clear. PERRL, EOM's intact. sclerae normal.  Heart:  normal rate and regular rhythm, no murmurs, clicks, or gallops.  Lungs: clear to auscultation.  Abdomen: BS normal.  Abdomen soft, non-tender.  No masses or organomegaly.  Extremities:  no cyanosis, clubbing, or edema.     Labs/imaging:   Performed at [facility], reviewed: see computer database.   Performed outside of [place] (reviewed if applicable): none     Impression/ plan:   colonoscopy     Per pt ok to discuss bx/procedure results with family and/or leave message on voicemail at cell phone.     Pre-Sedation/Anesthesia Assessment:    Patient is a candidate for moderate sedation.   Patient is ASA status: 2 - Mild, controlled systemic disease and no functional limitations    Airway Assessment:    Normal, Mallampati class 2 and Mouth opening: normal  ROM normal      The procedure, risks, benefits, and alternatives were explained.  All patient  questions were answered.  The informed consent was signed, and will be scanned into the computer database at a later date.     Patient barriers to learning: none     Patient/family understanding: verbalizes     Trinda Pascal, MD

## 2016-06-14 NOTE — Progress Notes (Signed)
Gastroenterology/hepatology Pre-procedure History & Physical   IMMEDIATE PRE-SEDATION ASSESSMENT    Referring provider: Tito Dine, MD  No att. providers found    Procedure: Colonoscopy     HPI:  Russell Freeman is a 70yr old male w/ history of HTN, melanoma of skin, BPH,  who presents for screening colonoscopy.     Last hemoglobin 16. Last platelet 206. Last INR n/a.   No blood thinners.  Denies family history of stomach or colon cancer.     Last Colon: [place] in 2012: "polyps"    Currently,  feeling well, no acute symptoms.     History:  No past medical history on file.      Current Outpatient Prescriptions:     Fexofenadine (ALLEGRA) 180 mg tablet, TAKE 1 TABLET(S) EVERY DAY BY ORAL ROUTE., Disp: 30 tablet, Rfl: 3    Finasteride (PROSCAR) 5 mg Tablet, Take 1 tablet by mouth every day. (prostate), Disp: 90 tablet, Rfl: 2    Fluticasone (FLONASE) 50 mcg/actuation nasal spray, Instill 1-2 sprays into EACH nostril every day. (allergies), Disp: 16 g, Rfl: 5    Lisinopril (PRINIVIL, ZESTRIL) 10 mg Tablet, Take 1 tablet by mouth every day., Disp: 90 tablet, Rfl: 3    Pantoprazole (PROTONIX) 40 mg Delayed Release Tablet, Take 1 tablet by mouth every day., Disp: 90 tablet, Rfl: 2    PEG-Electrolyte Soln (COLYTE, GOLYTELY) 236-22.74-6.74 -5.86 gram Liquid, Take as directed per [place] Instructions, Disp: 1 bottle, Rfl: 0    Tamsulosin (FLOMAX) 0.4 mg Capsule, Take 2 capsules by mouth every day at bedtime. (prostate), Disp: 90 capsule, Rfl: 2    Social History:  Social History     Social History    Marital status: N/A     Spouse name: N/A    Number of children: N/A    Years of education: N/A     Social History Main Topics    Smoking status: Never Smoker    Smokeless tobacco: Never Used    Alcohol use No    Drug use: Not on file    Sexual activity: Not on file     Other Topics Concern    Not on file     Social History Narrative       ROS: 10 point ROS was obtained and is negative except as  mentioned in the HPI.    The patient's past medical, family, and social history was reviewed and confirmed.    I did review all available medical, surgical, personal/social history.     Physical exam:   General Appearance: alert, no distress, pleasant affect, cooperative.  Eyes: conjunctivae and corneas clear. sclerae normal.  Mouth: normal.  Heart: normal rate and regular rhythm.  Lungs: clear to auscultation.  Abdomen: BS normal. Abdomen soft, non-tender.  Extremities:  no edema.       Labs/imaging:   Performed at [facility], reviewed: see computer database.       Impression/ plan:   Russell Freeman is a 70yr old male w/ history of HTN, melanoma of skin, BPH,  who presents for screening colonoscopy.     Pre-Sedation/Anesthesia Assessment:    Patient is a candidate for moderate sedation.   Patient is ASA status: 2 - Mild, controlled systemic disease and no functional limitations    Airway Assessment:    Mallampati class 2  ROM normal    Per pt ok to discuss bx/procedure results with family and/or leave message on voicemail.    The procedure, risks, benefits, and  alternatives were explained.  All patient questions were answered.  The informed consent was signed, and will be scanned into the computer database at a later date.     Patient barriers to learning: none     Patient/family understanding: verbalizes     Rowe Pavy, MD  GI Fellow  [phone]

## 2016-06-14 NOTE — Nursing Note (Signed)
Nursing Pre-Procedure Assessment    Darivs Goedeke arrived by ambulating  into clinic today at 9 from home.      Patient has arranged for a ride home from [place] who can be contacted at lobby. Patient agrees to having individual providing ride home present while the post procedure instructions and results are given.  Instructed patient that post sedation they are not to drive or drink alcohol for the remainder of the day.      Verified procedure with the patient scheduled today for: Colonoscopy.    Outpatient Prescriptions Marked as Taking for the 06/14/16 encounter (Office Visit) with Trinda Pascal, MD   Medication Sig Dispense Refill    Fluticasone (FLONASE) 50 mcg/actuation nasal spray Instill 1-2 sprays into EACH nostril every day. (allergies) 16 g 5    Lisinopril (PRINIVIL, ZESTRIL) 10 mg Tablet Take 1 tablet by mouth every day. 90 tablet 3    Pantoprazole (PROTONIX) 40 mg Delayed Release Tablet Take 1 tablet by mouth every day. 90 tablet 2     Anticoagulants: Patient denies taking Aspirin, NSAIDs, or other anticoagulants for the past 5 days.  OTC/Herbal preparations: none    Reviewed with the patient his health status in regards to allergies (including latex), medications, pregnancy, hypertension, atrial fibrilation, liver disease, bleeding disorders, diabetes,  CVA's, seizures, sleep apnea, glaucoma, cancer, prosthesis or implants, infectious diseases, surgical history; and disease of heart, lungs, liver, or kidneys. Positive history reviewed and updated in Health History section of the EMR.    Patient Active Problem List   Diagnosis    BPH (benign prostatic hyperplasia)    Essential hypertension    Gastroesophageal reflux disease without esophagitis     No Known Allergies  No past medical history on file.  Past Surgical History:   Procedure Laterality Date    COLONOSCOPY  2012    Polyps done in [place] was told 5 years    LAP UMBILICAL HERNIA REPAIR      REPAIR, HERNIA, INGUINAL,  LAPAROSCOPIC      THYROIDECTOMY, SUBTOTAL       History   Smoking Status    Never Smoker   Smokeless Tobacco    Never Used     History   Drug Use Not on file       Pre Procedure Checks:    Identaband on and two forms of identification information verified: yes  Pt completed bowel prep: split colyte  NPO since: 0330  Belongings are with patient under gurney.  Patient has: no glasses, dentures, jewelry, or hearing aides.  Barriers to Learning assessed: none. Patient verbalizes understanding of teaching and instructions.    Past anesthesia responses:  No problem.  Nursing Assessment Data Base:  Ventilation   --  Respirations: normal, unlabored.  Circulation/Perfusion -- Skin: warm, normal color and dry.  Cognition/Communication -- Behavior: alert and oriented, cooperative and calm.  Gastro-Intestinal: no distress.  Abdomen: soft, round and non-tender.  Level of Ambulation: self.    Reviewed written post procedure instructions with the patient (What To Expect After Your Procedure).   Barriers to Learning assessed: none. Patient verbalizes understanding of teaching and instructions.  Nursing assessment completed by:   Rayburn Ma, RN    Date: 06/14/2016 Time: [phone]

## 2016-06-14 NOTE — Procedures (Signed)
Patient: Russell Freeman  Location: [facility]  Medical record number: [id number]  Sex: male  Age: 72yr(s)  Date of birth: 1945-09-08    Procedure: Colonoscopy  Subprocedure:Cold forcep polypectomy     Date of Service: 06/14/2016    Endoscope insertion time: 0754  Cecal intubation time: 0759  Endoscope withdrawal time: 0811    Referring Physician: PCP: Tito Dine, MD    PERFORMING SURGEON: Trinda Pascal, MD I performed the entire procedure  ASSISTANT SURGEON: None    GI Pre-procedure Indications:  Screening for colon cancer    Details of the Procedure:    Informed consent was obtained for the procedure, including sedation.  Risks of perforation, hemorrhage, adverse drug reaction, pain, infection, missed lesion, incomplete examination, and aspiration were discussed.  A timeout was performed by nursing and medical staff to identify the patient (using two patient identifiers) and appropriate procedure. The patient was placed in the left lateral decubitus position. The patient was monitored continuously with EKG tracing, pulse oximetry, blood pressure monitoring, and direct observations.      A rectal examination was performed.  The Olympus colonoscope was inserted into the rectum and advanced under direct vision to the cecum, which was identified by the ileocecal valve and appendiceal orifice.  The quality of the colonic preparation was good with irrigation.  A careful inspection was made as the colonscope was withdrawn, including a retroflexed view of the rectum; findings and interventions are described below.  Photo documentation was obtained.  The patient tolerated the procedure well, and there were no complications.  He was taken to the recovery area in stable condition.      Sedation:  Sedation was administered for single procedure/procedures    Versed 4 mg IV and Fentanyl 100 mcg IV    Findings and Interventions:    hemorrhoids external small in size  left undisturbed  polyp(s) -#1, 3 mm in size, sessile,  located in the cecum, removed by cold biopsy and sent for pathology, -#2,  4 mm in size, sessile, located in the ascending colon, removed by cold biopsy and sent for pathology, -#3,  4 mm in size, sessile, located in the descending colon at 55cm, removed by cold biopsy and sent for pathology and -#4,  4 mm in size, sessile, located in the descending colon at 40cm, removed by cold biopsy and sent for pathology      Impression:    Benign appearing colon polyps, removed as above.  External hemorrhoids as described above.    Recommendation/Plan:    Await pathology.  Repeat colonoscopy in 3-5 years pending pathology results; recommend 2 day prep for next exam  High fiber diet    [place] [employer] Task Force colorectal cancer screening (CRC)/surveillance guidelines applicable to this patient (with modifications approved by the [facility] of Gastroenterology)*:    Individual with past medical history of polyp removal: -small hyperplastic polyp(s): see average risk, asymptomatic individual guidelines -one or two <1 cm polyp(s): repeat colonoscopy in 5-10 years -3-10 adenomas or any adenoma ? 1 cm: based on clinical judgment, but generally repeat colonoscopy in 3 years -more than 10 adenomas during one exam, any adenoma with villous features, a large sessile polyp removed piecemeal, or high-grade dysplasia: based on clinical judgment -patient age, comorbidities, and estimated life expectancy should be considered when determining risk vs benefit of surveillance.    *Due to the complicated nature of colorectal cancer screening and surveillance, this is not meant to be an all inclusive guideline.  The Division of Gastroenterology welcomes individual communication from providers for recommendations when a patient is encountered who does not fit in the above categories.    Trinda Pascal, MD  Attending Physician

## 2016-06-14 NOTE — Patient Instructions (Signed)
Discharge Instructions for colonoscopy    Findings:  Colonoscopy    4 polyp(s) removed and sent to pathology for review  hemorrhoids    Activity:    Rest today, resume usual activitiy tomorrow     Diet:    High fiber diet:     Medication:    Resume all usual medications    Follow up:   We will notify you of your biopsy results via phone call or letter.If you do not receive notice of your results by three weeks, please call us at the phone number provided below. and Repeat colonoscopy in 3-5 years pending pathology results      During your procedure, air was pumped into your GI tract so your doctor could see clearly to make a diagnosis and/or treat your problem.     Some possible side effects you may experience are:   - Discomfort due to a distended (bloated) abdomen which will subside after a few hours to two days.   - Nausea may be a side effect of the medication and will subside.   - The medications you received may make you dizzy and sleepy, it is important that you do not drive, operate machinery or drink alcohol for at least one day.   - Severe pain is not expected and should be reported    Other side effects may include:  Flex. Sig. Or Colonoscopy: A small amount of diarrhea may follow the exam.  Polyp Removal in Colon: A small amount of bleeding is not unusual. If it continues after one day, notify the doctor. If there is a large amount of bright red blood, go to the emergency Room and be seen by a doctor.    If problems, call [facility] at [phone] during business hours Monday through Friday 7:30am to 4:30 pm.  After hours call [phone] and ask to speak to the GI Fellow on call.

## 2016-06-15 MED FILL — midazolam (PF) 1 mg/mL injection solution: INTRAMUSCULAR | Qty: 8 | Status: AC

## 2016-06-15 MED FILL — fentaNYL (PF) 50 mcg/mL injection solution: INTRAMUSCULAR | Qty: 4 | Status: AC

## 2016-06-16 ENCOUNTER — Encounter: Payer: Self-pay | Admitting: Gastroenterology

## 2016-06-16 DIAGNOSIS — Z9889 Other specified postprocedural states: Secondary | ICD-10-CM | POA: Insufficient documentation

## 2016-06-16 LAB — SURGICAL PATHOLOGY

## 2016-08-08 ENCOUNTER — Telehealth: Payer: Self-pay | Admitting: Internal Medicine

## 2016-08-08 DIAGNOSIS — C439 Malignant melanoma of skin, unspecified: Secondary | ICD-10-CM | POA: Insufficient documentation

## 2016-08-08 NOTE — Telephone Encounter (Signed)
Patient is requesting a new dermatology referral to Dr. Karma Greaser at   [facility] [address] in [place]. Phone number [phone].   States the location is more convenient for him and she accepts his insurance.

## 2016-08-10 NOTE — Telephone Encounter (Signed)
Please advise 

## 2016-08-10 NOTE — Telephone Encounter (Signed)
Claiborne Billings from Dr. Kerry Fort office called said they are not accepting new patients at all. Thank you.

## 2016-08-31 ENCOUNTER — Ambulatory Visit (INDEPENDENT_AMBULATORY_CARE_PROVIDER_SITE_OTHER): Payer: Medicare Other

## 2016-08-31 ENCOUNTER — Ambulatory Visit: Payer: Medicare Other | Attending: Internal Medicine | Admitting: Internal Medicine

## 2016-08-31 ENCOUNTER — Encounter: Payer: Self-pay | Admitting: Internal Medicine

## 2016-08-31 VITALS — BP 128/82 | HR 60 | Temp 97.1°F | Wt 251.3 lb

## 2016-08-31 DIAGNOSIS — R9431 Abnormal electrocardiogram [ECG] [EKG]: Secondary | ICD-10-CM

## 2016-08-31 DIAGNOSIS — I10 Essential (primary) hypertension: Secondary | ICD-10-CM | POA: Insufficient documentation

## 2016-08-31 DIAGNOSIS — Z01818 Encounter for other preprocedural examination: Principal | ICD-10-CM

## 2016-08-31 LAB — COMPREHENSIVE METABOLIC PANEL
ALANINE TRANSFERASE (ALT): 29 U/L (ref 6–63)
ALBUMIN: 3.6 g/dL (ref 3.2–4.6)
ALKALINE PHOSPHATASE (ALP): 95 U/L (ref 35–115)
ASPARTATE TRANSAMINASE (AST): 22 U/L (ref 15–43)
BILIRUBIN TOTAL: 0.4 mg/dL (ref 0.3–1.3)
CALCIUM: 9.4 mg/dL (ref 8.6–10.5)
CARBON DIOXIDE TOTAL: 23 mmol/L — AB (ref 24–32)
CHLORIDE: 105 mmol/L (ref 95–110)
CREATININE BLOOD: 1.31 mg/dL — AB (ref 0.44–1.27)
E-GFR, NON-AFRICAN AMERICAN: 55 mL/min/{1.73_m2}
GLUCOSE: 97 mg/dL (ref 70–99)
POTASSIUM: 4.4 mmol/L (ref 3.3–5.0)
PROTEIN: 6.4 g/dL (ref 6.3–8.3)
Sodium: 139 mmol/L (ref 135–145)
UREA NITROGEN, BLOOD (BUN): 19 mg/dL (ref 8–22)

## 2016-08-31 LAB — CBC NO DIFFERENTIAL
HEMATOCRIT: 48 % (ref 41.0–53.0)
HEMOGLOBIN: 16.4 g/dL (ref 13.5–17.5)
MCH: 31.6 pg (ref 27.0–33.0)
MCHC: 34.3 % (ref 32.0–36.0)
MCV: 92.2 UM3 (ref 80.0–100.0)
MPV: 8.6 UM3 (ref 6.8–10.0)
PLATELET COUNT: 184 10*3/uL (ref 130–400)
RDW: 13.6 % (ref 0.0–14.7)
RED CELL COUNT: 5.2 10*6/uL (ref 4.50–5.90)
WHITE BLOOD CELL COUNT: 6.4 10*3/uL (ref 4.5–11.0)

## 2016-08-31 LAB — APTT STUDIES: APTT: 34 s (ref 24.1–36.7)

## 2016-08-31 LAB — URINALYSIS AND CULTURE IF IND
BILIRUBIN URINE: NEGATIVE
GLUCOSE URINE: NEGATIVE mg/dL
KETONES: NEGATIVE mg/dL
LEUK. ESTERASE: NEGATIVE
NITRITE URINE: NEGATIVE
OCCULT BLOOD URINE: NEGATIVE mg/dL
PH URINE: 6 (ref 4.8–7.8)
PROTEIN URINE: NEGATIVE mg/dL
SPECIFIC GRAVITY, URINE: 1.013 (ref 1.002–1.030)
UROBILINOGEN.: NEGATIVE mg/dL (ref ?–2.0)

## 2016-08-31 LAB — INR: INR: 0.9 (ref 0.87–1.18)

## 2016-08-31 NOTE — Nursing Note (Signed)
Patient identity confirmed using two identifiers.Vital signs taken, allergies verified, screened for pain.  EKG performed per doctor's order. Results reported/given to Dr Redmond Pulling.Golden Hurter, MA

## 2016-08-31 NOTE — Progress Notes (Signed)
SUBJECTIVE:   He presents for a pre-op evaluation and EKG. The patient is scheduled for a Laser Enucleation of the Prostate on 1/25 to be performed by Doctor Idolina Primer.    Patient Active Problem List   Diagnosis    BPH (benign prostatic hyperplasia)    Essential hypertension    Gastroesophageal reflux disease without esophagitis    S/P colonoscopy with polypectomy    Melanoma of skin      Russell Freeman does not currently have medications on file.    Past Surgical History:   Procedure Laterality Date    COLONOSCOPY  2012    Polyps done in [place] was told 5 years    [facility] COLONOSCOPY,BIOPSY  [id number]    LAP UMBILICAL HERNIA REPAIR      PR PARTIAL HIP REPLACEMENT      Hip replacement, partial    PR REMV CATARACT EXTRACAP,INSERT LENS,COMP      REPAIR, HERNIA, INGUINAL, LAPAROSCOPIC      THYROIDECTOMY, SUBTOTAL       No history of reaction to anaesthesia  Cardio-Pulmonary ROS:  Cardiovascular ROS - no TIA's, no chest pain on exertion, no dyspnea on exertion, no swelling of ankles  no history of pneumonia or bronchitis    MEDICATIONS:  Russell Freeman does not currently have medications on file.    OBJECTIVE:  BP 128/82  Pulse 60  Temp 36.2 C (97.1 F) (Temporal)  Wt 114 kg (251 lb 5.2 oz)  BMI 38.21 kg/m2   Appearance - in no apparent distress, well developed and well nourished, non-toxic, in no respiratory distress and acyanotic, alert, oriented times 3, well groomed and dressed, normal vitals and well hydrated.  Ear exam - both sides normal, TM intact without perforation or effusion, external canal normal. No significant cerumenosis noted.  Nasal exam - septum midline, no deformities, nares patent, normal mucosa without swelling, no polyps, no bleeding.  Throat - Oral cavity, tongue, pharynx and palate have no inflammation or suspicious lesions. No erythema or exudates. Uvula midline. Tonsils - tonsils are present and normal.  Sinuses - Paranasal sinuses are normal. No tenderness to the maxillary, frontal,  ethmoids or mastoids.  Neck - neck is supple and free of adenopathy or masses, the thyroid is normal without enlargement or nodules. No JVD. Carotids 2+ without bruits.  Chest Exam - Chest is clear, no wheezing, rhonchi or rales. Normal symmetric air entry throughout both lung fields. No chest wall deformities or tenderness.  Cardiac Exam - S1 and S2 normal, no murmurs, clicks, gallops or rubs. Regular rate and rhythm. Normal PMI. No edema or JVD.  Mental status exam; he is alert, orient to time, person and place. Normal thought content, speech, affect, mood and dress are noted.     EKG: normal EKG, normal sinus rhythm, left axis deviation.     IMPRESSION and PLAN:  (Z01.818) Preoperative examination  (primary encounter diagnosis)  Comment: The patient is usual risk for the planned procedure. Medically cleared.   Labs today    FOLLOW UP prn   Total time of visit 25 minutes greater than 50% of time was spent counseling about:  Preoperative examination  (primary encounter diagnosis).   The patient verbalizes understanding and agrees with the plan of care.   Patient does have access to My Chart.  History reviewed and updated.  Barriers to Learning assessed: none. Patient verbalizes understanding of teaching and instructions. An after visit summary was provided to the patient. The patient was instructed in all aspects  of care and expressed comprehension.   Education given to the patient regarding diagnosis and follow-up.     Electronically signed by:  Bethena Midget, M.D.  [place]

## 2016-09-01 ENCOUNTER — Other Ambulatory Visit: Payer: Self-pay | Admitting: Internal Medicine

## 2016-09-01 DIAGNOSIS — J301 Allergic rhinitis due to pollen: Principal | ICD-10-CM

## 2016-09-06 ENCOUNTER — Telehealth: Payer: Self-pay | Admitting: Internal Medicine

## 2016-09-06 NOTE — Telephone Encounter (Signed)
Done . Faxed as requested.

## 2016-09-06 NOTE — Telephone Encounter (Signed)
Please fax 08/31/2016 labs to the number listed.

## 2016-09-06 NOTE — Telephone Encounter (Signed)
Russell Freeman states she received everything for the upcoming surgery on 09/15/16 except for the labs and urinalysis. Please send the results to [facility] urology at [phone]. Thanks.

## 2016-09-12 LAB — POC ELECTROCARDIOGRAM WITH RHYTHM STRIP: QTC: 376

## 2016-10-03 ENCOUNTER — Encounter: Payer: Self-pay | Admitting: Internal Medicine

## 2016-10-03 ENCOUNTER — Ambulatory Visit: Payer: Medicare Other | Admitting: Internal Medicine

## 2016-10-03 VITALS — BP 106/84 | HR 101 | Temp 97.9°F | Wt 246.7 lb

## 2016-10-03 DIAGNOSIS — J209 Acute bronchitis, unspecified: Principal | ICD-10-CM

## 2016-10-03 MED ORDER — CODEINE 10 MG-GUAIFENESIN 100 MG/5 ML ORAL LIQUID
5.0000 mL | ORAL | 0 refills | Status: DC | PRN
Start: 2016-10-03 — End: 2016-10-28

## 2016-10-03 MED ORDER — AZITHROMYCIN 250 MG TABLET
ORAL_TABLET | ORAL | 0 refills | Status: AC
Start: 2016-10-03 — End: 2016-10-08

## 2016-10-03 NOTE — Nursing Note (Signed)
Patient identity confirmed using two identifiers.Vital signs taken, allergies verified, screened for pain.  Khaylee Mcevoy, MA

## 2016-10-03 NOTE — Patient Instructions (Signed)
Bronchitis: Care Instructions  Your Care Instructions     Bronchitis is inflammation of the bronchial tubes, which carry air to the lungs. The tubes swell and produce mucus, or phlegm. The mucus and inflamed bronchial tubes make you cough. You may have trouble breathing.  Most cases of bronchitis are caused by viruses like those that cause colds. Antibiotics usually do not help and they may be harmful.  Bronchitis usually develops rapidly and lasts about 2 to 3 weeks in otherwise healthy people.  Follow-up care is a key part of your treatment and safety. Be sure to make and go to all appointments, and call your doctor if you are having problems. It's also a good idea to know your test results and keep a list of the medicines you take.  How can you care for yourself at home?   Take all medicines exactly as prescribed. Call your doctor if you think you are having a problem with your medicine.   Get some extra rest.   Take an over-the-counter pain medicine, such as acetaminophen (Tylenol), ibuprofen (Advil, Motrin), or naproxen (Aleve) to reduce fever and relieve body aches. Read and follow all instructions on the label.   Do not take two or more pain medicines at the same time unless the doctor told you to. Many pain medicines have acetaminophen, which is Tylenol. Too much acetaminophen (Tylenol) can be harmful.   Take an over-the-counter cough medicine that contains dextromethorphan to help quiet a dry, hacking cough so that you can sleep. Avoid cough medicines that have more than one active ingredient. Read and follow all instructions on the label.   Breathe moist air from a humidifier, hot shower, or sink filled with hot water. The heat and moisture will thin mucus so you can cough it out.   Do not smoke. Smoking can make bronchitis worse. If you need help quitting, talk to your doctor about stop-smoking programs and medicines. These can increase your chances of quitting for good.  When should you call for  help?  Call 911 anytime you think you may need emergency care. For example, call if:   You have severe trouble breathing.  Call your doctor now or seek immediate medical care if:   You have new or worse trouble breathing.   You cough up dark brown or bloody mucus (sputum).   You have a new or higher fever.   You have a new rash.  Watch closely for changes in your health, and be sure to contact your doctor if:   You cough more deeply or more often, especially if you notice more mucus or a change in the color of your mucus.   You are not getting better as expected.   Where can you learn more?   Go to https://www.healthwise.net/patiented  Enter H333 in the search box to learn more about "Bronchitis: Care Instructions."    2006-2015 [employer]wise[employer]. Care instructions adapted under license by [facility]. This care instruction is for use with your licensed healthcare professional. If you have questions about a medical condition or this instruction, always ask your healthcare professional. Healthwise, Incorporated disclaims any warranty or liability for your use of this information.  Content Version: 10.6.465758; Current as of: April 30, 2013

## 2016-10-03 NOTE — Progress Notes (Signed)
SUBJECTIVE:  Russell Freeman is a 71yr old male    Chief Complaint: Patient presents today with Congestion (nasal); Cough; Other (chills); Medication Follow Up (refill); and Sore Throat    Patient Active Problem List   Diagnosis    BPH (benign prostatic hyperplasia)    Essential hypertension    Gastroesophageal reflux disease without esophagitis    S/P colonoscopy with polypectomy    Melanoma of skin      Current Outpatient Prescriptions on File Prior to Visit   Medication Sig Dispense Refill    Fexofenadine (ALLEGRA) 180 mg tablet TAKE 1 TABLET BY MOUTH EVERY DAY 30 tablet 5    Finasteride (PROSCAR) 5 mg Tablet Take 1 tablet by mouth every day. (prostate) 90 tablet 2    Fluticasone (FLONASE) 50 mcg/actuation nasal spray Instill 1-2 sprays into EACH nostril every day. (allergies) 16 g 5    Lisinopril (PRINIVIL, ZESTRIL) 10 mg Tablet Take 1 tablet by mouth every day. 90 tablet 3    Pantoprazole (PROTONIX) 40 mg Delayed Release Tablet Take 1 tablet by mouth every day. 90 tablet 2    Tamsulosin (FLOMAX) 0.4 mg Capsule Take 2 capsules by mouth every day at bedtime. (prostate) 90 capsule 2     No current facility-administered medications on file prior to visit.      He complains of sinus and nasal congestion, sore throat, nasal blockage, post nasal drip, chest congestion, productive cough and cough described as productive of yellow and green sputum for 7 days. Denies a history of sweats, chills, fevers, myalgias, wheezing, shortness of breath, chest pain, weakness, fatigue, dizziness, nausea and vomiting. Denies a history of asthma. He patient does not smoke cigarettes.   OTC Medications tried: OTC    Family History:  his family history includes Hypertension in his father and mother.    Social History:  He  reports that he has never smoked. He has never used smokeless tobacco. He reports that he does not drink alcohol.     OBJECTIVE:   BP 106/84  Pulse 101  Temp 36.6 C (97.9 F) (Temporal)  Wt 111.9 kg (246  lb 11.1 oz)  SpO2 94%  BMI 37.51 kg/m2   Appearance - in no apparent distress, well developed and well nourished, non-toxic, in no respiratory distress and acyanotic, alert, oriented times 3, well groomed and dressed, normal vitals and well hydrated.  Ear exam - both sides normal, TM intact without perforation or effusion, external canal normal. No significant cerumenosis noted.  Nasal exam - septum midline, no deformities, nares patent, normal mucosa without swelling, no polyps, no bleeding.  Throat - Oral cavity, tongue, pharynx and palate have no inflammation or suspicious lesions. No erythema or exudates. Uvula midline. Tonsils - tonsils are present and normal.  Sinuses - Paranasal sinuses are normal. No tenderness to the maxillary, frontal, ethmoids or mastoids.  Neck - neck is supple and free of adenopathy or masses, the thyroid is normal without enlargement or nodules. No JVD. Carotids 2+ without bruits.   Chest Exam - Chest is clear, no wheezing, rhonchi or rales. Normal symmetric air entry throughout both lung fields. No chest wall deformities or tenderness.    IMPRESSION and PLAN:  (J20.9) Acute bronchitis  (primary encounter diagnosis)  Comment: bronchitis  Symptomatic therapy suggested: push fluids, rest and ROV prn if symptoms persist or worsen. Call or return to clinic prn if these symptoms worsen or fail to improve as anticipated.  To the ER with any increased cough, chest  pain, dyspnea, wheezing or hemoptysis. Or fever, chills or night sweats.  Allergies to antibiotics were reviewed. The common side-effects of antibiotics were discussed. GI symptoms can occur. Watch for rash or hives or sign off allergic reaction and discontinue and seek medical care immediately.  Chest xray if no better in 2-3 days.   Plan: antibiotics as ordered          FOLLOW UP prn  Total time of visit 20 minutes greater than 50% of time was spent counseling about:  No diagnosis found..   The patient verbalizes understanding  and agrees with the plan of care.   Patient does have access to My Chart.  History reviewed and updated.  Barriers to Learning assessed: none. Patient verbalizes understanding of teaching and instructions. An after visit summary was provided to the patient. The patient was instructed in all aspects of care and expressed comprehension.   Education given to the patient regarding diagnosis and follow-up.     Electronically signed by:  Bethena Midget, M.D.  [place]

## 2016-10-26 ENCOUNTER — Other Ambulatory Visit: Payer: Self-pay | Admitting: Internal Medicine

## 2016-10-28 ENCOUNTER — Ambulatory Visit (INDEPENDENT_AMBULATORY_CARE_PROVIDER_SITE_OTHER): Payer: Medicare Other | Admitting: Internal Medicine

## 2016-10-28 ENCOUNTER — Ambulatory Visit
Admission: RE | Admit: 2016-10-28 | Discharge: 2016-10-28 | Disposition: A | Discharge status: Home / Self Care | Payer: Medicare Other | Source: Ambulatory Visit | Attending: Internal Medicine | Admitting: Internal Medicine

## 2016-10-28 ENCOUNTER — Encounter: Payer: Self-pay | Admitting: Internal Medicine

## 2016-10-28 VITALS — BP 110/74 | HR 76 | Temp 98.5°F | Wt 251.5 lb

## 2016-10-28 DIAGNOSIS — J4520 Mild intermittent asthma, uncomplicated: Principal | ICD-10-CM

## 2016-10-28 DIAGNOSIS — H1013 Acute atopic conjunctivitis, bilateral: Secondary | ICD-10-CM

## 2016-10-28 MED ORDER — AZITHROMYCIN 250 MG TABLET
ORAL_TABLET | ORAL | 0 refills | Status: AC
Start: 2016-10-28 — End: 2016-11-02

## 2016-10-28 MED ORDER — CODEINE 10 MG-GUAIFENESIN 100 MG/5 ML ORAL LIQUID
5.0000 mL | ORAL | 0 refills | Status: AC | PRN
Start: 2016-10-28 — End: 2016-11-27

## 2016-10-28 MED ORDER — OLOPATADINE 0.1 % EYE DROPS
1.0000 [drp] | Freq: Two times a day (BID) | OPHTHALMIC | 3 refills | Status: AC
Start: 2016-10-28 — End: 2017-10-23

## 2016-10-28 MED ORDER — ALBUTEROL SULFATE HFA 90 MCG/ACTUATION AEROSOL INHALER
1.0000 | INHALATION_SPRAY | RESPIRATORY_TRACT | 2 refills | Status: AC | PRN
Start: 2016-10-28 — End: 2017-10-23

## 2016-10-28 NOTE — Progress Notes (Signed)
SUBJECTIVE:  Russell Freeman is a 71yr old male    Chief Complaint: Cough     Patient Active Problem List   Diagnosis    BPH (benign prostatic hyperplasia)    Essential hypertension    Gastroesophageal reflux disease without esophagitis    S/P colonoscopy with polypectomy    Melanoma of skin      Current Outpatient Prescriptions on File Prior to Visit   Medication Sig Dispense Refill    Fexofenadine (ALLEGRA) 180 mg tablet TAKE 1 TABLET BY MOUTH EVERY DAY 30 tablet 5    Finasteride (PROSCAR) 5 mg Tablet Take 1 tablet by mouth every day. (prostate) 90 tablet 2    Fluticasone (FLONASE) 50 mcg/actuation nasal spray INSTILL 1 TO 2 SPRAYS INTO EACH NOSTRIL EVERY DAY. (ALLERGIES) 16 mL 5    Guaifenesin 100mg -Codeine 10mg /36ml (ROBITUSSIN AC) 10-100 mg/5 mL Liquid Take 5 mL by mouth every 4 hours if needed. 240 mL 0    Lisinopril (PRINIVIL, ZESTRIL) 10 mg Tablet Take 1 tablet by mouth every day. 90 tablet 3    Pantoprazole (PROTONIX) 40 mg Delayed Release Tablet Take 1 tablet by mouth every day. 90 tablet 2    Tamsulosin (FLOMAX) 0.4 mg Capsule Take 2 capsules by mouth every day at bedtime. (prostate) 90 capsule 2     No current facility-administered medications on file prior to visit.      Seen last month and treated for bronchitis  Got entirely better and now for one day he has had a cough and some wheezing and URI symptoms and allergy symptoms including sneezing and itchy eyes.   Having bad allergy symptoms. These include coryza, nasal blockage, post nasal drip, itching in eyes and dry cough.  Has tried Advertising account planner.  Has not been tested for allergies.  Is allergic to pollen.    Has been on Lisinopril for years and is reluctant to change this because of the cough.   He is seen with asthma like symptoms but doesn't carry a diagnosis of this for one days. Wheezing is described as mild. Associated symptoms:dry cough. Current asthma medications: none. Patient does not smoke cigarettes.       The patient  denies chest pain, dyspnea or hemoptysis.     Family History:  his family history includes Hypertension in his father and mother.    Social History:  He  reports that he has never smoked. He has never used smokeless tobacco. He reports that he does not drink alcohol.     OBJECTIVE:   BP 110/74  Pulse 76  Temp 36.9 C (98.5 F) (Temporal)  Wt 114.1 kg (251 lb 8.7 oz)  BMI 38.25 kg/m2   Appearance - in no apparent distress, well developed and well nourished, non-toxic, in no respiratory distress and acyanotic, alert, oriented times 3, well groomed and dressed, normal vitals and well hydrated.  Ear exam - both sides normal, TM intact without perforation or effusion, external canal normal. No significant cerumenosis noted.  Nasal exam - septum midline, no deformities, nares patent, normal mucosa without swelling, no polyps, no bleeding.  Throat - Oral cavity, tongue, pharynx and palate have no inflammation or suspicious lesions. No erythema or exudates. Uvula midline. Tonsils - tonsils are absent.  Sinuses - Paranasal sinuses are normal. No tenderness to the maxillary, frontal, ethmoids or mastoids.  Neck - neck is supple and free of adenopathy or masses, the thyroid is normal without enlargement or nodules. No JVD. Carotids 2+ without bruits.  The patient appears in no respiratory distress and acyanotic. ENT: normal ears, throat, neck without lymphadenopathy. Chest:pulse oximetry on room air is 94%, mild expiratory wheezing heard diffusely throughout both lungs, clearing with cough Heart exam - S1, S2 normal, no murmur, no gallop, rate regular. Abdomen soft, nontender, no masses or organomegaly. No rashes noted.   Extremities - extremities, peripheral pulses and reflexes normal, no edema, redness or tenderness in the calves or thighs, no ulcers, gangrene or trophic changes, Homan's sign is negative, no sign of DVT, distal neurovascular exam intact.   Mental status exam; he is alert, orient to time, person and  place. Normal thought content, speech, affect, mood and dress are noted.     IMPRESSION and PLAN:  (J45.20) Mild intermittent reactive airway disease without complication  (primary encounter diagnosis)  Comment: Reactive Airway Disease due to infection/inflammation - exacerbation  oximetry on room air was 94%  RX per orders - Use bronchodilator MDI 2 puff q4h prn, consider steroids (oral)  The pathophysiology of this is explained.  We've discussed the importance of compliance with medical regimen, and various treatment modalities such as beta agonists and inhaled steroids.  The concepts of prophylactic and episodic or 'rescue' therapy has been discussed. To the ER with any increased cough, chest pain, dyspnea, wheezing or hemoptysis. Or fever, chills or night sweats.   The proper method of use, as well as anticipated side effects, of this metered-dose inhaler are discussed and demonstrated to the patient.  Plan: Azithromycin (ZITHROMAX Z-PAK) 250 mg Tablet,         Guaifenesin 100mg -Codeine 10mg /20ml (ROBITUSSIN         AC) 10-100 mg/5 mL Liquid, Albuterol (PROAIR         HFA, PROVENTIL HFA, VENTOLIN HFA) 90         mcg/actuation inhaler, DX CHEST 2 VIEWS        The proper method of use, as well as anticipated side effects, of this metered-dose inhaler are discussed and demonstrated to the patient.   Chest xray today    (H10.13) Allergic conjunctivitis of both eyes  Comment: CONJUNCTIVITIS - allergic   Plan: Olopatadine (PATANOL) 0.1 % Ophthalmic Solution        Call or return to clinic prn if these symptoms worsen or fail to improve as anticipated.     FOLLOW UP prn   Total time of visit 25 minutes greater than 50% of time was spent counseling about:  Mild intermittent reactive airway disease without complication  (primary encounter diagnosis)  Allergic conjunctivitis of both eyes.   The patient verbalizes understanding and agrees with the plan of care.   Patient does have access to My Chart.  History reviewed and  updated.  Barriers to Learning assessed: none. Patient verbalizes understanding of teaching and instructions. An after visit summary was provided to the patient. The patient was instructed in all aspects of care and expressed comprehension.   Education given to the patient regarding diagnosis and follow-up.     Electronically signed by:  Bethena Midget, M.D.  [place]

## 2016-10-28 NOTE — Nursing Note (Signed)
Patient identity confirmed using two identifiers.Vital signs taken, allergies verified, screened for pain.  Vann Okerlund, MA

## 2016-10-28 NOTE — Patient Instructions (Signed)
Using a Metered-Dose Inhaler: Care Instructions  Your Care Instructions     A metered-dose inhaler lets you breathe medicine into your lungs quickly. Inhaled medicine works faster than the same medicine in a pill. An inhaler allows you to take less medicine than you would need if you took it as a pill.  "Metered-dose" means that the inhaler gives a measured amount of medicine each time you use it. A metered-dose inhaler gives medicine in the form of a liquid mist.  Your doctor may want you to use a spacer with your inhaler. A spacer is a chamber that you attach to the inhaler. The chamber holds the medicine before you inhale it. That way, you can inhale the medicine in as many breaths as you need. Doctors recommend using a spacer with most metered-dose inhalers, especially those with corticosteroid medicines.  Follow-up care is a key part of your treatment and safety. Be sure to make and go to all appointments, and call your doctor if you are having problems. It's also a good idea to know your test results and keep a list of the medicines you take.  How can you care for yourself at home?  To get started using your inhaler   Talk with your health care provider to be sure you are using your inhaler the right way. It might help if you practice using it in front of a mirror. Use the inhaler exactly as prescribed.   Check that you have the correct medicine. If you use more than one inhaler, put a label on each one. This will let you know which one to use at the right time.   Keep track of how much medicine is in the inhaler. Check the label to see how many doses are in the container. If you know how many puffs you can take, you can replace the inhaler before you run out. Ask your health care provider how you can keep track of how much medicine is left.   Use a spacer if you have problems pressing the inhaler and breathing in at the same time. You also may need a spacer if you are using corticosteroid  medicines.   If you are using a corticosteroid inhaler, gargle and rinse out your mouth with water after use. Do not swallow the water. Swallowing the water will increase the chance that the medicine will get into your bloodstream. This may make it more likely that you will have side effects.  To use a spacer with an inhaler   Shake the inhaler. Remove the inhaler cap, and place the mouthpiece of the inhaler into the spacer. Check the inhaler instructions to see if you need to prime your inhaler before you use it. If it needs priming, follow the instructions on how to prime your inhaler.   Remove the cap from the spacer.   Hold the inhaler upright with the mouthpiece at the bottom.   Tilt your head back a little, and breathe out slowly and completely.   Place the spacer's mouthpiece in your mouth.   Press down on the inhaler to spray one puff of medicine into the spacer, and then start breathing in slowly. Wait to inhale until after you have pressed down on the inhaler.   Hold your breath for 10 seconds. This will let the medicine settle in your lungs.   If you need to take a second dose, wait 30 to 60 seconds to allow the inhaler valve to refill.  To use   an inhaler without a spacer   Shake the inhaler as directed. Remove the cap. Check the instructions to see if you need to prime your inhaler before you use it. If it needs priming, follow the instructions on how to prime your inhaler.   Hold the inhaler upright with the mouthpiece at the bottom.   Tilt your head back a little, and breathe out slowly and completely.   Position the inhaler in one of two ways:   You can place the inhaler 1 to 2 inches in front of your open mouth, without closing your lips over it. Try to open your mouth as wide as you can. Placing the inhaler in front of your open mouth may be better for getting the medicine into your lungs. But some people may find this too hard to do.   Or you can place the inhaler in your mouth. This  is easier for most people. And it lowers the risk that any of the medicine will get into your eyes.   Start taking slow, even breaths through your mouth. Press down on the inhaler once, then inhale fully.   Hold your breath for 10 seconds. This will let the medicine settle in your lungs.   If you need to take a second dose, wait 30 to 60 seconds to allow the inhaler valve to refill.   Where can you learn more?   Go to https://www.healthwise.net/patiented  Enter K111 in the search box to learn more about "Using a Metered-Dose Inhaler: Care Instructions."    2006-2015 [employer]. Care instructions adapted under license by [facility]. This care instruction is for use with your licensed healthcare professional. If you have questions about a medical condition or this instruction, always ask your healthcare professional. Healthwise, Inc[employer] disclaims any warranty or liability for your use of this information.  Content Version: 10.6.465758; Current as of: April 30, 2013

## 2016-11-01 ENCOUNTER — Telehealth: Payer: Self-pay | Admitting: Internal Medicine

## 2016-11-01 NOTE — Telephone Encounter (Signed)
Patient states the [facility] office did not receive his fax. Requesting it to be faxed again to them.  Printed and faxed referral to [phone] with a successful result.

## 2016-11-02 ENCOUNTER — Ambulatory Visit: Payer: Medicare Other | Attending: Internal Medicine | Admitting: Internal Medicine

## 2016-11-02 ENCOUNTER — Ambulatory Visit (INDEPENDENT_AMBULATORY_CARE_PROVIDER_SITE_OTHER): Payer: Medicare Other

## 2016-11-02 ENCOUNTER — Encounter: Payer: Self-pay | Admitting: Internal Medicine

## 2016-11-02 VITALS — BP 110/78 | HR 67 | Temp 96.9°F | Wt 249.6 lb

## 2016-11-02 DIAGNOSIS — C61 Malignant neoplasm of prostate: Secondary | ICD-10-CM | POA: Insufficient documentation

## 2016-11-02 DIAGNOSIS — R05 Cough: Secondary | ICD-10-CM | POA: Insufficient documentation

## 2016-11-02 DIAGNOSIS — I10 Essential (primary) hypertension: Principal | ICD-10-CM | POA: Insufficient documentation

## 2016-11-02 DIAGNOSIS — R059 Cough, unspecified: Secondary | ICD-10-CM

## 2016-11-02 LAB — BASIC METABOLIC PANEL
CALCIUM: 9.2 mg/dL (ref 8.6–10.5)
CARBON DIOXIDE TOTAL: 27 mmol/L (ref 24–32)
CHLORIDE: 103 mmol/L (ref 95–110)
CREATININE BLOOD: 1.32 mg/dL — AB (ref 0.44–1.27)
E-GFR, NON-AFRICAN AMERICAN: 55 mL/min/{1.73_m2}
GLUCOSE: 101 mg/dL — AB (ref 70–99)
POTASSIUM: 4.5 mmol/L (ref 3.3–5.0)
SODIUM: 138 mmol/L (ref 135–145)
UREA NITROGEN, BLOOD (BUN): 17 mg/dL (ref 8–22)

## 2016-11-02 MED ORDER — OLMESARTAN 20 MG TABLET
20.0000 mg | ORAL_TABLET | Freq: Every day | ORAL | 11 refills | Status: DC
Start: 2016-11-02 — End: 2017-03-10

## 2016-11-02 NOTE — Nursing Note (Signed)
Patient identity confirmed using two identifiers.Vital signs taken, allergies verified, screened for pain.  Naama Sappington, MA

## 2016-11-02 NOTE — Progress Notes (Signed)
SUBJECTIVE:  Russell Freeman is a 71yr old male    Chief Complaint: Cough     Patient Active Problem List   Diagnosis    BPH (benign prostatic hyperplasia)    Essential hypertension    Gastroesophageal reflux disease without esophagitis    S/P colonoscopy with polypectomy    Melanoma of skin      Current Outpatient Prescriptions on File Prior to Visit   Medication Sig Dispense Refill    Fexofenadine (ALLEGRA) 180 mg tablet TAKE 1 TABLET BY MOUTH EVERY DAY 30 tablet 5    Finasteride (PROSCAR) 5 mg Tablet Take 1 tablet by mouth every day. (prostate) 90 tablet 2    Fluticasone (FLONASE) 50 mcg/actuation nasal spray INSTILL 1 TO 2 SPRAYS INTO EACH NOSTRIL EVERY DAY. (ALLERGIES) 16 mL 5    Guaifenesin 100mg -Codeine 10mg /79ml (ROBITUSSIN AC) 10-100 mg/5 mL Liquid Take 5 mL by mouth every 4 hours if needed. 240 mL 0    Lisinopril (PRINIVIL, ZESTRIL) 10 mg Tablet Take 1 tablet by mouth every day. 90 tablet 3    Pantoprazole (PROTONIX) 40 mg Delayed Release Tablet Take 1 tablet by mouth every day. 90 tablet 2    Tamsulosin (FLOMAX) 0.4 mg Capsule Take 2 capsules by mouth every day at bedtime. (prostate) 90 capsule 2     No current facility-administered medications on file prior to visit.      Seen last month and treated for bronchitis. He is much better now. He is concerned about the ectatic aorta.   Got entirely better and now for one day he has had a cough and some wheezing and URI symptoms and allergy symptoms including sneezing and itchy eyes.   Having bad allergy symptoms. These include coryza, nasal blockage, post nasal drip, itching in eyes and dry cough.  Has tried [employer] account planner.  Has not been tested for allergies.  Is allergic to pollen.    Has been on Lisinopril for years and is reluctant to change this because of the cough. We discussed this again. He feels the cough is better but his wife thinks it is still worthwhile changing.   He is seen with asthma like symptoms but doesn't carry a diagnosis of  this for one days. Wheezing is described as mild. Associated symptoms:dry cough. Current asthma medications: none. Patient does not smoke cigarettes.       The patient denies chest pain, dyspnea or hemoptysis.     Family History:  his family history includes Hypertension in his father and mother.    Social History:  He  reports that he has never smoked. He has never used smokeless tobacco. He reports that he does not drink alcohol.     OBJECTIVE:   BP 110/78  Pulse 67  Temp 36.1 C (96.9 F) (Temporal)  Wt 113.2 kg (249 lb 9 oz)  SpO2 95%  BMI 37.95 kg/m2   Appearance - in no apparent distress, well developed and well nourished, non-toxic, in no respiratory distress and acyanotic, alert, oriented times 3, well groomed and dressed, normal vitals and well hydrated.   Mental status exam; he is alert, orient to time, person and place. Normal thought content, speech, affect, mood and dress are noted.     IMPRESSION and PLAN:  (R05) Cough  (primary encounter diagnosis)  Comment: improved   Plan: CT CHEST W + WO CONTRAST        Call or return to clinic prn if these symptoms worsen or fail to improve as  anticipated.     (I10) Essential hypertension  Comment: well controlled but will change medication   Plan: BASIC METABOLIC PANEL        Discussed sodium restriction, maintaining ideal body weight and regular exercise program as physiologic means to achieve blood pressure control. The patient will strive towards this. Meanwhile, it is appropriate to lower BP with medications, while observing for therapeutic effect and if appropriate later, can discontinue medications if physiologic methods appear to be effective. The patient indicates understanding of these issues and agrees with the plan. The various types of antihypertensives are discussed fully. See medication orders in Epic[facility]. Side effects explained in detail. Continue home readings and see me for followup as scheduled. Low dose aspirin daily advised.      FOLLOW  UP 1 month  Total time of visit 20 minutes greater than 50% of time was spent counseling about:  Cough  (primary encounter diagnosis)  Essential hypertension.   The patient verbalizes understanding and agrees with the plan of care.   Patient does have access to My Chart.  History reviewed and updated.  Barriers to Learning assessed: none. Patient verbalizes understanding of teaching and instructions. An after visit summary was provided to the patient. The patient was instructed in all aspects of care and expressed comprehension.   Education given to the patient regarding diagnosis and follow-up.     Electronically signed by:  Bethena Midget, M.D.  [place]

## 2016-11-07 ENCOUNTER — Encounter: Payer: Self-pay | Admitting: Internal Medicine

## 2016-11-07 NOTE — Telephone Encounter (Signed)
From: Elwyn Reach  To: Tito Dine, MD  Sent: 11/05/2016 8:24 AM PDT  Subject: Test Result Question    Does my creatinine serum level pose a problem for my ct scan. Also what does the high level of creatinine serum mean.

## 2016-11-08 ENCOUNTER — Encounter: Payer: Self-pay | Admitting: Internal Medicine

## 2016-11-08 NOTE — Telephone Encounter (Signed)
Spoke with patient. Informed him that Dr. Redmond Pulling is out of the office until Monday. Patient is okay to wait for PCP.     Patient is ok if PCP MyChart or call after reviewing test result.

## 2016-11-08 NOTE — Telephone Encounter (Signed)
Tell pt Dr. Redmond Pulling is out of the office. He can wait for her to return.

## 2016-11-08 NOTE — Telephone Encounter (Signed)
From: Elwyn Reach  To: Tito Dine, MD  Sent: 11/08/2016 2:11 PM PDT  Subject: Test Result Question    They called for me to schedule the ct scan but I am hesistant to do so until I here from you. My creatintine level was above the normal level so I do not know if that prevents me from doing the test with contrast as planned. Let me know as to how I should proceed.

## 2016-11-24 ENCOUNTER — Other Ambulatory Visit: Payer: Self-pay | Admitting: Internal Medicine

## 2016-11-24 MED ORDER — PANTOPRAZOLE 40 MG TABLET,DELAYED RELEASE
40.0000 mg | DELAYED_RELEASE_TABLET | Freq: Every day | ORAL | 2 refills | Status: DC
Start: 2016-11-24 — End: 2017-08-09

## 2016-12-02 ENCOUNTER — Ambulatory Visit: Payer: Medicare Other | Admitting: Internal Medicine

## 2016-12-14 ENCOUNTER — Encounter: Payer: Self-pay | Admitting: Internal Medicine

## 2016-12-14 DIAGNOSIS — E669 Obesity, unspecified: Secondary | ICD-10-CM | POA: Insufficient documentation

## 2017-02-11 ENCOUNTER — Other Ambulatory Visit: Payer: Self-pay | Admitting: Internal Medicine

## 2017-02-11 DIAGNOSIS — J301 Allergic rhinitis due to pollen: Principal | ICD-10-CM

## 2017-03-09 ENCOUNTER — Other Ambulatory Visit: Payer: Self-pay | Admitting: Internal Medicine

## 2017-03-09 MED ORDER — FLUTICASONE PROPIONATE 50 MCG/ACTUATION NASAL SPRAY,SUSPENSION
NASAL | 5 refills | Status: DC
Start: 2017-03-09 — End: 2017-08-03

## 2017-03-09 NOTE — Telephone Encounter (Signed)
Mai from [facility] is requesting a refill for the patient for Fluticasone (FLONASE) 50 mcg/actuation nasal spray

## 2017-03-10 ENCOUNTER — Other Ambulatory Visit: Payer: Self-pay | Admitting: Internal Medicine

## 2017-03-10 MED ORDER — OLMESARTAN 20 MG TABLET
20.0000 mg | ORAL_TABLET | Freq: Every day | ORAL | 1 refills | Status: DC
Start: 2017-03-10 — End: 2017-03-14

## 2017-03-13 ENCOUNTER — Other Ambulatory Visit: Payer: Self-pay | Admitting: Internal Medicine

## 2017-03-13 DIAGNOSIS — J301 Allergic rhinitis due to pollen: Principal | ICD-10-CM

## 2017-03-14 ENCOUNTER — Other Ambulatory Visit: Payer: Self-pay | Admitting: Internal Medicine

## 2017-03-14 MED ORDER — OLMESARTAN 20 MG TABLET
20.0000 mg | ORAL_TABLET | Freq: Every day | ORAL | 1 refills | Status: AC
Start: 2017-03-14 — End: 2018-03-09

## 2017-05-10 ENCOUNTER — Other Ambulatory Visit: Payer: Self-pay | Admitting: Family Medicine

## 2017-05-10 DIAGNOSIS — J301 Allergic rhinitis due to pollen: Principal | ICD-10-CM

## 2017-05-10 MED ORDER — FEXOFENADINE 180 MG TABLET
180.0000 mg | ORAL_TABLET | Freq: Every day | ORAL | 2 refills | Status: DC
Start: 2017-05-10 — End: 2018-03-15

## 2017-08-03 ENCOUNTER — Other Ambulatory Visit: Payer: Self-pay | Admitting: Internal Medicine

## 2017-08-03 MED ORDER — FLUTICASONE PROPIONATE 50 MCG/ACTUATION NASAL SPRAY,SUSPENSION
NASAL | 5 refills | Status: AC
Start: 2017-08-03 — End: 2018-01-03

## 2017-08-09 ENCOUNTER — Other Ambulatory Visit: Payer: Self-pay | Admitting: Internal Medicine

## 2017-08-09 MED ORDER — PANTOPRAZOLE 40 MG TABLET,DELAYED RELEASE
40.0000 mg | DELAYED_RELEASE_TABLET | Freq: Every day | ORAL | 0 refills | Status: AC
Start: 2017-08-09 — End: 2018-08-09

## 2017-08-09 NOTE — Telephone Encounter (Signed)
Last office visit: 11/02/2016  Last refill: 11/24/2016  Patient have not establish with pcp.   Please advise

## 2017-08-11 ENCOUNTER — Other Ambulatory Visit: Payer: Self-pay | Admitting: Internal Medicine

## 2018-03-15 ENCOUNTER — Other Ambulatory Visit: Payer: Self-pay | Admitting: Internal Medicine

## 2018-03-15 DIAGNOSIS — J301 Allergic rhinitis due to pollen: Principal | ICD-10-CM

## 2018-03-16 MED ORDER — FEXOFENADINE 180 MG TABLET
180.0000 mg | ORAL_TABLET | Freq: Every day | ORAL | 2 refills | Status: AC
Start: 2018-03-16 — End: 2018-09-16

## 2018-06-18 MED ORDER — NALOXONE 0.4 MG/ML INJECTION SOLUTION
0.10 mg | INTRAMUSCULAR | Status: DC
Start: ? — End: 2018-06-18

## 2018-06-18 MED ORDER — SODIUM CHLORIDE 0.9 % INJECTION SOLUTION
2.50 mL | INTRAMUSCULAR | Status: DC
Start: ? — End: 2018-06-18

## 2018-06-18 MED ORDER — SODIUM CHLORIDE 0.9 % INJECTION SOLUTION
2.50 mL | INTRAMUSCULAR | Status: DC
Start: 2018-06-18 — End: 2018-06-18

## 2018-06-18 MED ORDER — OXYCODONE 10 MG TABLET
10.00 mg | ORAL_TABLET | ORAL | Status: DC
Start: ? — End: 2018-06-18

## 2018-06-18 MED ORDER — ATROPINE 0.1 MG/ML INJECTION SYRINGE
0.40 mg | INJECTION | INTRAMUSCULAR | Status: DC
Start: ? — End: 2018-06-18

## 2018-06-18 MED ORDER — GLYCOPYRROLATE 0.2 MG/ML INJECTION SOLUTION
0.10 mg | INTRAMUSCULAR | Status: DC
Start: ? — End: 2018-06-18

## 2018-06-18 MED ORDER — D5 / 0.45% NACL W KCL 20 MEQ/L IV SOLUTION
1000.00 mL | INTRAVENOUS | Status: DC
Start: ? — End: 2018-06-18

## 2018-06-18 MED ORDER — LACTATED RINGERS INTRAVENOUS SOLUTION
INTRAVENOUS | Status: DC
Start: ? — End: 2018-06-18

## 2018-06-18 MED ORDER — HYDRALAZINE 20 MG/ML INJECTION SOLUTION
5.00 mg | INTRAMUSCULAR | Status: DC
Start: ? — End: 2018-06-18

## 2018-06-18 MED ORDER — PROMETHAZINE 25 MG RECTAL SUPPOSITORY
25.00 mg | RECTAL | Status: DC
Start: ? — End: 2018-06-18

## 2018-06-18 MED ORDER — MEPERIDINE (PF) 25 MG/ML INJECTION SOLUTION
12.50 mg | INTRAMUSCULAR | Status: DC
Start: ? — End: 2018-06-18

## 2018-06-18 MED ORDER — HYDROMORPHONE 2 MG/ML INJECTION SOLUTION
1.00 mg | INTRAMUSCULAR | Status: DC
Start: ? — End: 2018-06-18

## 2018-06-18 MED ORDER — DIPHENHYDRAMINE 50 MG/ML INJECTION SYRINGE
12.50 mg | INJECTION | INTRAMUSCULAR | Status: DC
Start: ? — End: 2018-06-18

## 2018-06-18 MED ORDER — FENTANYL (PF) 50 MCG/ML INJECTION SOLUTION
25.00 ug | INTRAMUSCULAR | Status: DC
Start: ? — End: 2018-06-18

## 2018-06-18 MED ORDER — ONDANSETRON HCL (PF) 4 MG/2 ML INJECTION SOLUTION
4.00 mg | INTRAMUSCULAR | Status: DC
Start: ? — End: 2018-06-18

## 2018-06-18 MED ORDER — LABETALOL 5 MG/ML INTRAVENOUS SOLUTION
5.00 mg | INTRAVENOUS | Status: DC
Start: ? — End: 2018-06-18

## 2018-06-18 MED ORDER — METOCLOPRAMIDE 5 MG/ML INJECTION SYRINGE
10.00 mg | INJECTION | INTRAMUSCULAR | Status: DC
Start: ? — End: 2018-06-18

## 2019-10-13 MED ORDER — LACTATED RINGERS INTRAVENOUS SOLUTION
30.00 mL/kg | INTRAVENOUS | Status: DC
Start: ? — End: 2019-10-13

## 2019-10-13 MED ORDER — GENERIC EXTERNAL MEDICATION
2.50 mL | Status: DC
Start: ? — End: 2019-10-13

## 2019-11-18 ENCOUNTER — Other Ambulatory Visit: Payer: Self-pay | Admitting: Family Medicine

## 2020-03-03 MED ORDER — GABAPENTIN 300 MG CAPSULE
300.00 mg | ORAL_CAPSULE | ORAL | Status: DC
Start: 2020-03-03 — End: 2020-03-03

## 2020-03-03 MED ORDER — ALBUTEROL SULFATE CONCENTRATE 5 MG/ML(0.5 %) SOLUTION FOR NEBULIZATION
2.50 mg | INHALATION_SOLUTION | RESPIRATORY_TRACT | Status: DC
Start: ? — End: 2020-03-03

## 2020-03-03 MED ORDER — ASPIRIN 81 MG CHEWABLE TABLET
81.00 mg | CHEWABLE_TABLET | ORAL | Status: DC
Start: 2020-03-04 — End: 2020-03-03

## 2020-03-03 MED ORDER — PANTOPRAZOLE 40 MG TABLET,DELAYED RELEASE
40.00 mg | DELAYED_RELEASE_TABLET | ORAL | Status: DC
Start: 2020-03-04 — End: 2020-03-03

## 2020-03-03 MED ORDER — ACETAMINOPHEN 325 MG TABLET
650.00 mg | ORAL_TABLET | ORAL | Status: DC
Start: ? — End: 2020-03-03

## 2020-03-03 MED ORDER — BUPIVACAINE (PF) 0.5 % (5 MG/ML) INJECTION SOLUTION
30.00 mL | INTRAMUSCULAR | Status: DC
Start: ? — End: 2020-03-03

## 2020-03-03 MED ORDER — OLANZAPINE 5 MG DISINTEGRATING TABLET
5.00 mg | DISINTEGRATING_TABLET | ORAL | Status: DC
Start: ? — End: 2020-03-03

## 2020-03-03 MED ORDER — GENERIC EXTERNAL MEDICATION
1.00 | Status: DC
Start: ? — End: 2020-03-03

## 2020-03-03 MED ORDER — BISACODYL 10 MG RECTAL SUPPOSITORY
10.00 mg | RECTAL | Status: DC
Start: ? — End: 2020-03-03

## 2020-03-03 MED ORDER — AMINOPHYLLINE 250 MG/10 ML INTRAVENOUS SOLUTION
50.00 mg | INTRAVENOUS | Status: DC
Start: ? — End: 2020-03-03

## 2020-03-03 MED ORDER — MELATONIN 3 MG TABLET
9.00 mg | ORAL_TABLET | ORAL | Status: DC
Start: 2020-03-03 — End: 2020-03-03

## 2020-03-03 MED ORDER — METOPROLOL TARTRATE 25 MG TABLET
25.00 mg | ORAL_TABLET | ORAL | Status: DC
Start: 2020-03-03 — End: 2020-03-03

## 2020-03-03 MED ORDER — LACTULOSE 10 GRAM/15 ML ORAL SOLUTION
30.00 mL | ORAL | Status: DC
Start: ? — End: 2020-03-03

## 2020-03-03 MED ORDER — METHOCARBAMOL 500 MG TABLET
500.00 mg | ORAL_TABLET | ORAL | Status: DC
Start: ? — End: 2020-03-03

## 2020-03-03 MED ORDER — LIDOCAINE 5 % TOPICAL PATCH
2.00 | MEDICATED_PATCH | TOPICAL | Status: DC
Start: 2020-03-03 — End: 2020-03-03

## 2020-03-03 MED ORDER — SENNOSIDES 8.6 MG-DOCUSATE SODIUM 50 MG TABLET
2.00 | ORAL_TABLET | ORAL | Status: DC
Start: 2020-03-04 — End: 2020-03-03

## 2020-03-03 MED ORDER — HYDROCODONE 5 MG-ACETAMINOPHEN 325 MG TABLET
1.00 | ORAL_TABLET | ORAL | Status: DC
Start: ? — End: 2020-03-03

## 2020-03-03 MED ORDER — NITROGLYCERIN 0.4 MG SUBLINGUAL TABLET
0.40 mg | SUBLINGUAL_TABLET | SUBLINGUAL | Status: DC
Start: ? — End: 2020-03-03

## 2020-03-03 MED ORDER — POLYETHYLENE GLYCOL 3350 17 GRAM ORAL POWDER PACKET
17.00 g | ORAL | Status: DC
Start: 2020-03-04 — End: 2020-03-03

## 2020-03-03 MED ORDER — HEPARIN (PORCINE) 5,000 UNIT/ML INJECTION SYRINGE
5000.00 [IU] | INJECTION | INTRAMUSCULAR | Status: DC
Start: 2020-03-03 — End: 2020-03-03

## 2020-03-03 MED ORDER — SODIUM CHLORIDE 0.9 % INJECTION SOLUTION
5.00 mL | INTRAMUSCULAR | Status: DC
Start: ? — End: 2020-03-03

## 2020-03-03 MED ORDER — NALOXONE 0.4 MG/ML INJECTION SOLUTION
0.10 mg | INTRAMUSCULAR | Status: DC
Start: ? — End: 2020-03-03

## 2020-03-13 MED ORDER — BISACODYL 10 MG RECTAL SUPPOSITORY
10.00 mg | RECTAL | Status: DC
Start: ? — End: 2020-03-13

## 2020-03-13 MED ORDER — POLYETHYLENE GLYCOL 3350 17 GRAM ORAL POWDER PACKET
17.00 g | ORAL | Status: DC
Start: 2020-03-14 — End: 2020-03-13

## 2020-03-13 MED ORDER — TRAZODONE 50 MG TABLET
50.00 mg | ORAL_TABLET | ORAL | Status: DC
Start: 2020-03-13 — End: 2020-03-13

## 2020-03-13 MED ORDER — LIDOCAINE 5 % TOPICAL PATCH
2.00 | MEDICATED_PATCH | TOPICAL | Status: DC
Start: 2020-03-14 — End: 2020-03-13

## 2020-03-13 MED ORDER — ACETAMINOPHEN 325 MG TABLET
650.00 mg | ORAL_TABLET | ORAL | Status: DC
Start: ? — End: 2020-03-13

## 2020-03-13 MED ORDER — GABAPENTIN 300 MG CAPSULE
300.00 mg | ORAL_CAPSULE | ORAL | Status: DC
Start: 2020-03-13 — End: 2020-03-13

## 2020-03-13 MED ORDER — HYDROCODONE 5 MG-ACETAMINOPHEN 325 MG TABLET
1.00 | ORAL_TABLET | ORAL | Status: DC
Start: ? — End: 2020-03-13

## 2020-03-13 MED ORDER — METOPROLOL TARTRATE 25 MG TABLET
25.00 mg | ORAL_TABLET | ORAL | Status: DC
Start: 2020-03-13 — End: 2020-03-13

## 2020-03-13 MED ORDER — ALLOPURINOL 100 MG TABLET
100.00 mg | ORAL_TABLET | ORAL | Status: DC
Start: 2020-03-14 — End: 2020-03-13

## 2020-03-13 MED ORDER — SENNOSIDES 8.6 MG TABLET
17.20 mg | ORAL_TABLET | ORAL | Status: DC
Start: 2020-03-13 — End: 2020-03-13

## 2020-03-13 MED ORDER — ALBUTEROL SULFATE CONCENTRATE 5 MG/ML(0.5 %) SOLUTION FOR NEBULIZATION
2.50 mg | INHALATION_SOLUTION | RESPIRATORY_TRACT | Status: DC
Start: ? — End: 2020-03-13

## 2020-03-13 MED ORDER — ASPIRIN 81 MG CHEWABLE TABLET
81.00 mg | CHEWABLE_TABLET | ORAL | Status: DC
Start: 2020-03-14 — End: 2020-03-13

## 2020-03-13 MED ORDER — LACTULOSE 10 GRAM/15 ML ORAL SOLUTION
30.00 mL | ORAL | Status: DC
Start: ? — End: 2020-03-13

## 2020-03-13 MED ORDER — ENOXAPARIN 300 MG/3 ML SUBCUTANEOUS SOLUTION
40.00 mg | SUBCUTANEOUS | Status: DC
Start: 2020-03-13 — End: 2020-03-13

## 2020-03-13 MED ORDER — PANTOPRAZOLE 40 MG TABLET,DELAYED RELEASE
40.00 mg | DELAYED_RELEASE_TABLET | ORAL | Status: DC
Start: 2020-03-14 — End: 2020-03-13

## 2020-03-13 MED ORDER — DOCUSATE SODIUM 100 MG CAPSULE
200.00 mg | ORAL_CAPSULE | ORAL | Status: DC
Start: 2020-03-13 — End: 2020-03-13

## 2020-03-13 MED ORDER — METHOCARBAMOL 500 MG TABLET
500.00 mg | ORAL_TABLET | ORAL | Status: DC
Start: ? — End: 2020-03-13

## 2020-03-26 MED ORDER — DIPHENHYDRAMINE 50 MG/ML INJECTION SYRINGE
25.00 mg | INJECTION | INTRAMUSCULAR | Status: DC
Start: ? — End: 2020-03-26

## 2020-03-26 MED ORDER — ALBUTEROL SULFATE HFA 90 MCG/ACTUATION AEROSOL INHALER
1.00 | INHALATION_SPRAY | RESPIRATORY_TRACT | Status: DC
Start: ? — End: 2020-03-26

## 2020-03-26 MED ORDER — SODIUM DI- AND MONOPHOSPHATE-POTASSIUM PHOS MONOBASIC 250 MG TABLET
2.00 | ORAL_TABLET | ORAL | Status: DC
Start: 2020-03-26 — End: 2020-03-26

## 2020-03-26 MED ORDER — HYDRALAZINE 20 MG/ML INJECTION SOLUTION
5.00 mg | INTRAMUSCULAR | Status: DC
Start: ? — End: 2020-03-26

## 2020-03-26 MED ORDER — SODIUM CHLORIDE 0.9 % INJECTION SOLUTION
2.50 mL | INTRAMUSCULAR | Status: DC
Start: ? — End: 2020-03-26

## 2020-03-26 MED ORDER — ALLOPURINOL 100 MG TABLET
100.00 mg | ORAL_TABLET | ORAL | Status: DC
Start: 2020-03-27 — End: 2020-03-26

## 2020-03-26 MED ORDER — POLYETHYLENE GLYCOL 3350 17 GRAM ORAL POWDER PACKET
17.00 g | ORAL | Status: DC
Start: 2020-03-27 — End: 2020-03-26

## 2020-03-26 MED ORDER — FAMOTIDINE 10 MG/ML INTRAVENOUS SOLUTION
20.00 mg | INTRAVENOUS | Status: DC
Start: ? — End: 2020-03-26

## 2020-03-26 MED ORDER — SENNOSIDES 8.6 MG TABLET
17.20 mg | ORAL_TABLET | ORAL | Status: DC
Start: 2020-03-26 — End: 2020-03-26

## 2020-03-26 MED ORDER — BISACODYL 10 MG RECTAL SUPPOSITORY
10.00 mg | RECTAL | Status: DC
Start: ? — End: 2020-03-26

## 2020-03-26 MED ORDER — NALOXONE 0.4 MG/ML INJECTION SOLUTION
0.10 mg | INTRAMUSCULAR | Status: DC
Start: ? — End: 2020-03-26

## 2020-03-26 MED ORDER — LACTULOSE 10 GRAM/15 ML ORAL SOLUTION
30.00 mL | ORAL | Status: DC
Start: ? — End: 2020-03-26

## 2020-03-26 MED ORDER — DOCUSATE SODIUM 100 MG CAPSULE
200.00 mg | ORAL_CAPSULE | ORAL | Status: DC
Start: 2020-03-26 — End: 2020-03-26

## 2020-03-26 MED ORDER — ONDANSETRON HCL (PF) 4 MG/2 ML INJECTION SOLUTION
4.00 mg | INTRAMUSCULAR | Status: DC
Start: ? — End: 2020-03-26

## 2020-03-26 MED ORDER — ASPIRIN 81 MG CHEWABLE TABLET
81.00 mg | CHEWABLE_TABLET | ORAL | Status: DC
Start: 2020-03-27 — End: 2020-03-26

## 2020-03-26 MED ORDER — LIDOCAINE 5 % TOPICAL PATCH
1.00 | MEDICATED_PATCH | TOPICAL | Status: DC
Start: 2020-03-26 — End: 2020-03-26

## 2020-03-26 MED ORDER — ACYCLOVIR 200 MG CAPSULE
200.00 mg | ORAL_CAPSULE | ORAL | Status: DC
Start: 2020-03-26 — End: 2020-03-26

## 2020-03-26 MED ORDER — METOPROLOL TARTRATE 50 MG TABLET
50.00 mg | ORAL_TABLET | ORAL | Status: DC
Start: 2020-03-26 — End: 2020-03-26

## 2020-03-26 MED ORDER — METHYLPREDNISOLONE SODIUM SUCCINATE 125 MG SOLUTION FOR INJECTION
125.00 mg | INTRAMUSCULAR | Status: DC
Start: ? — End: 2020-03-26

## 2020-03-26 MED ORDER — SUCRALFATE 100 MG/ML ORAL SUSPENSION
1.00 g | ORAL | Status: DC
Start: 2020-03-26 — End: 2020-03-26

## 2020-03-26 MED ORDER — FAMOTIDINE 20 MG TABLET
20.00 mg | ORAL_TABLET | ORAL | Status: DC
Start: 2020-03-27 — End: 2020-03-26

## 2020-03-26 MED ORDER — FLUMAZENIL 0.1 MG/ML INTRAVENOUS SOLUTION
0.20 mg | INTRAVENOUS | Status: DC
Start: ? — End: 2020-03-26

## 2020-03-26 MED ORDER — EPINEPHRINE 0.3 MG/0.3 ML INJECTION, AUTO-INJECTOR
0.30 mg | AUTO-INJECTOR | INTRAMUSCULAR | Status: DC
Start: ? — End: 2020-03-26

## 2020-03-26 MED ORDER — MELATONIN 3 MG TABLET
6.00 mg | ORAL_TABLET | ORAL | Status: DC
Start: ? — End: 2020-03-26

## 2020-03-26 MED ORDER — LORAZEPAM 0.5 MG TABLET
0.50 mg | ORAL_TABLET | ORAL | Status: DC
Start: ? — End: 2020-03-26

## 2020-03-26 MED ORDER — ACETAMINOPHEN 325 MG TABLET
650.00 mg | ORAL_TABLET | ORAL | Status: DC
Start: ? — End: 2020-03-26

## 2020-03-26 MED ORDER — ACETAMINOPHEN 325 MG TABLET
975.00 mg | ORAL_TABLET | ORAL | Status: DC
Start: ? — End: 2020-03-26

## 2020-03-26 MED ORDER — CYCLOBENZAPRINE 10 MG TABLET
10.00 mg | ORAL_TABLET | ORAL | Status: DC
Start: ? — End: 2020-03-26

## 2020-03-26 MED ORDER — CYANOCOBALAMIN (VIT B-12) 1,000 MCG TABLET
1000.00 ug | ORAL_TABLET | ORAL | Status: DC
Start: 2020-03-27 — End: 2020-03-26

## 2020-03-26 MED ORDER — FOLIC ACID 1 MG TABLET
1.00 mg | ORAL_TABLET | ORAL | Status: DC
Start: 2020-03-27 — End: 2020-03-26

## 2020-03-26 MED ORDER — TRAZODONE 50 MG TABLET
100.00 mg | ORAL_TABLET | ORAL | Status: DC
Start: 2020-03-26 — End: 2020-03-26

## 2020-03-26 MED ORDER — FERROUS SULFATE 324 MG (65 MG IRON) TABLET,ENTERIC COATED
324.00 mg | DELAYED_RELEASE_TABLET | ORAL | Status: DC
Start: 2020-03-26 — End: 2020-03-26

## 2020-03-26 MED ORDER — SODIUM CHLORIDE 0.9 % INTRAVENOUS SOLUTION
1000.00 mL | INTRAVENOUS | Status: DC
Start: ? — End: 2020-03-26

## 2020-03-26 MED ORDER — HEPARIN (PORCINE) 5,000 UNIT/ML INJECTION SYRINGE
5000.00 [IU] | INJECTION | INTRAMUSCULAR | Status: DC
Start: 2020-03-26 — End: 2020-03-26

## 2022-04-29 IMAGING — CT CT ABDOMEN AND PELVIS WITHOUT CONTRAST
2 of 3 series · 15 of 46 positions shown, 17 images · non-contrast
Comparison: None.

________________________________________________________________________________________________ 
CT ABDOMEN AND PELVIS WITHOUT CONTRAST, 04/29/2022 [DATE]: 
CLINICAL INDICATION: Abdominal and pelvic pain with bloating is greatest in the 
left upper quadrant. 
A search for DICOM formatted images was conducted for prior CT imaging studies 
completed at a non-affiliated media free facility.
TECHNIQUE: The abdomen and pelvis were scanned from lung bases through the 
pubic rami without contrast on a high-resolution CT scanner using dose reduction 
techniques. Routine MPR reconstructions were performed.

[Series 2: abd/pel ax wo · axial · 0.98mm/px · z∈[-655,-160]mm · 12 of 191 slices shown, 14 images]
[im 13/191  soft-tissue]
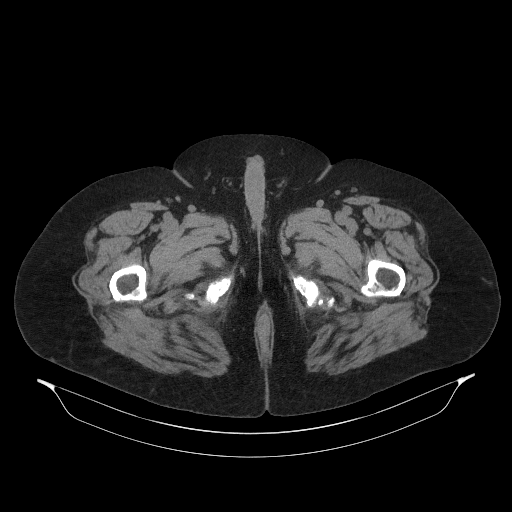
[im 13/191  bone]
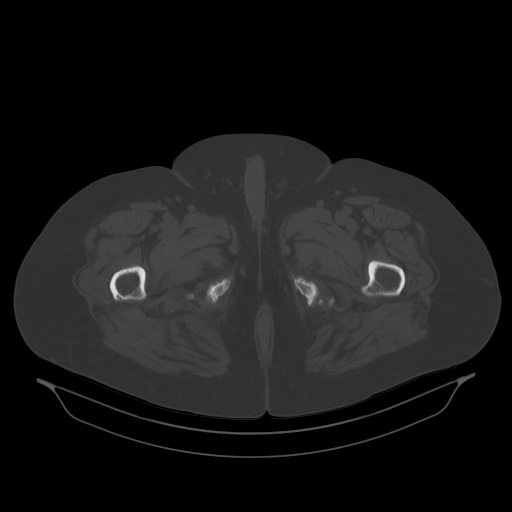
[im 25/191  soft-tissue]
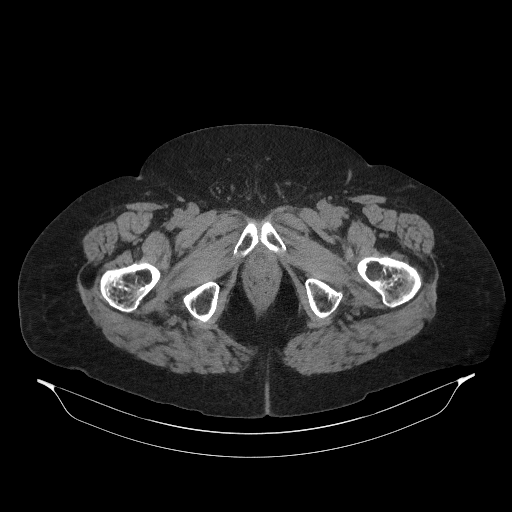
[im 43/191  soft-tissue]
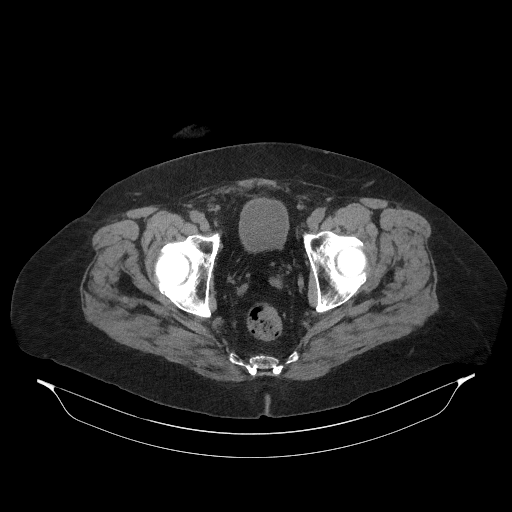
[im 56/191  soft-tissue]
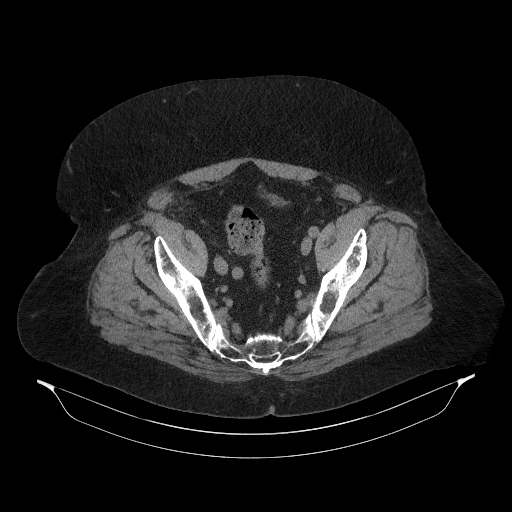
[im 74/191  soft-tissue]
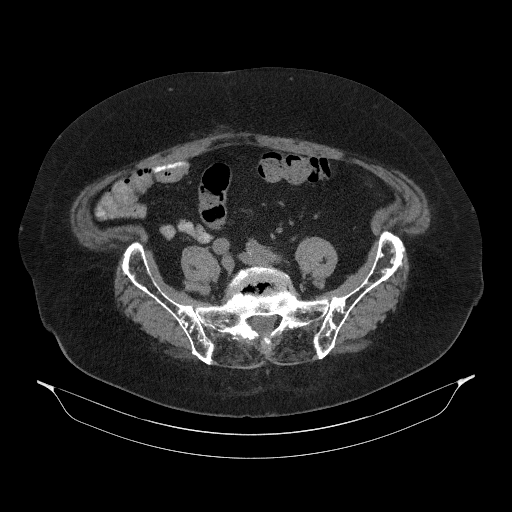
[im 86/191  soft-tissue]
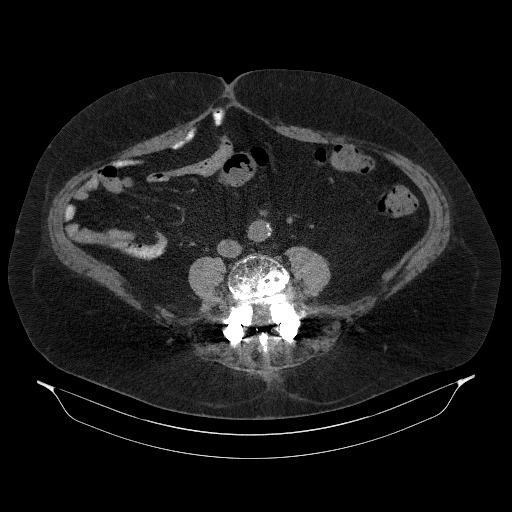
[im 105/191  soft-tissue]
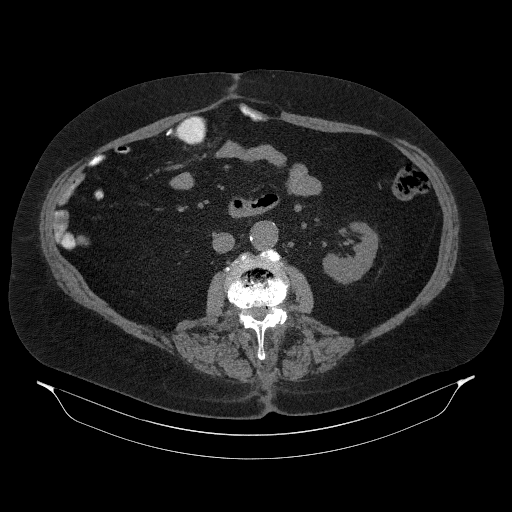
[im 117/191  soft-tissue]
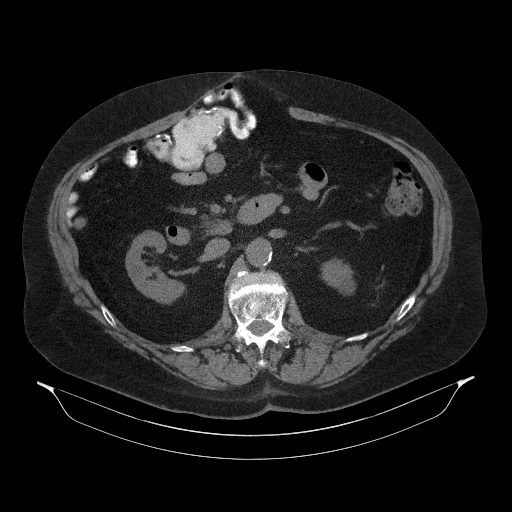
[im 135/191  soft-tissue]
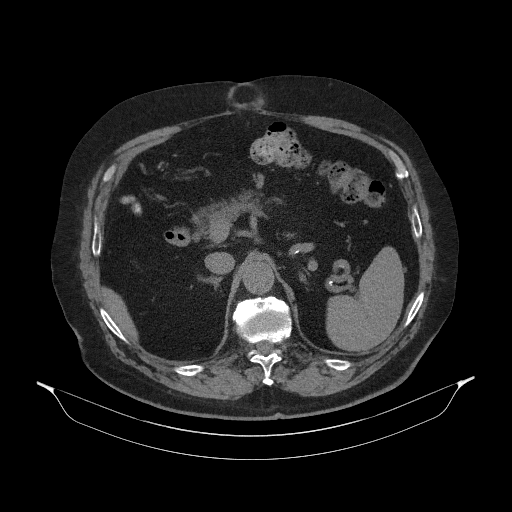
[im 135/191  bone]
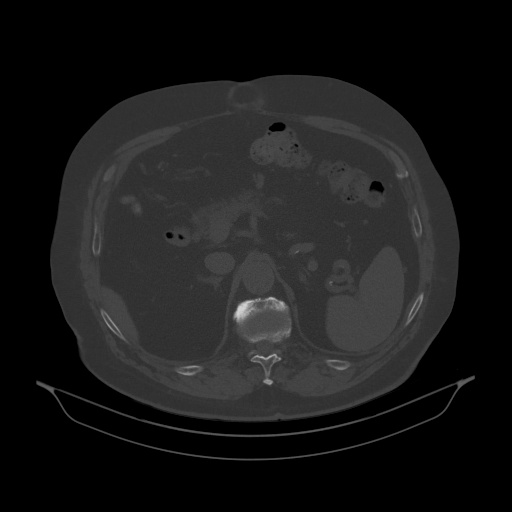
[im 148/191  soft-tissue]
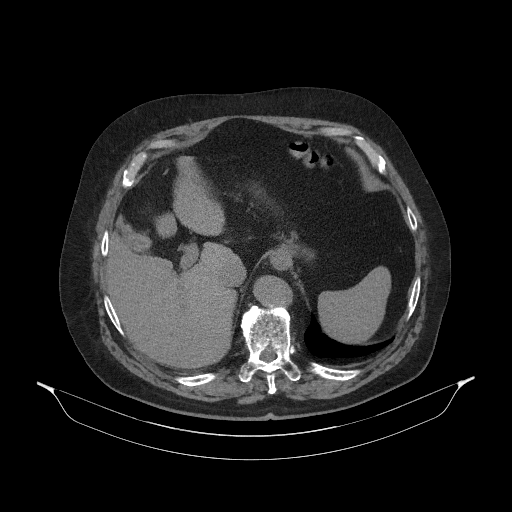
[im 166/191  soft-tissue]
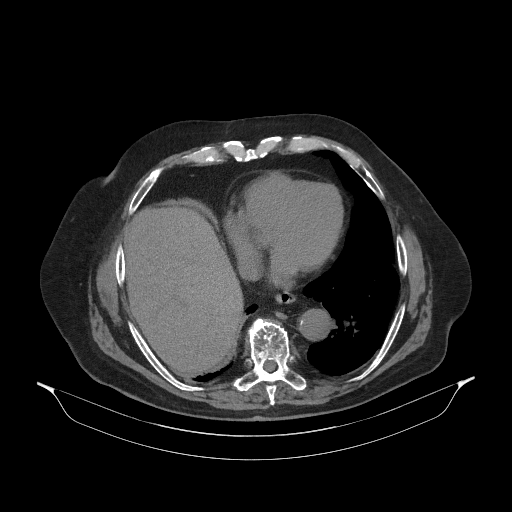
[im 178/191  soft-tissue]
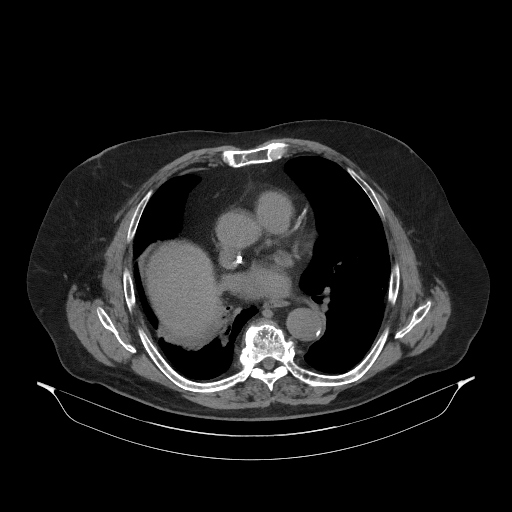

[Series 4: cor from thins · coronal · 0.94mm/px · 3 of 174 slices shown]
[im 58/174  soft-tissue]
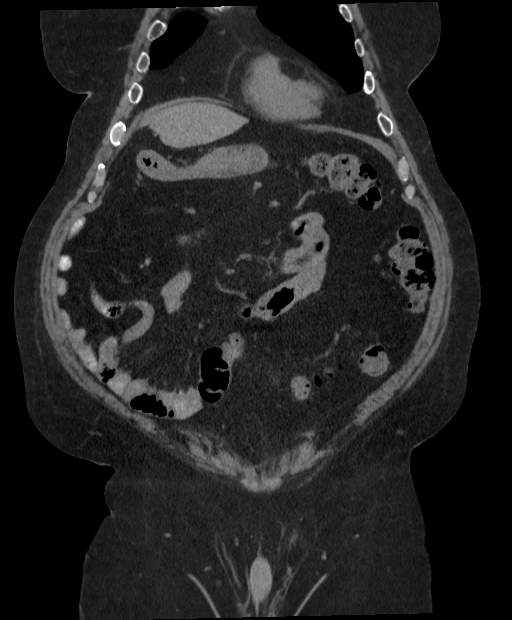
[im 77/174  soft-tissue]
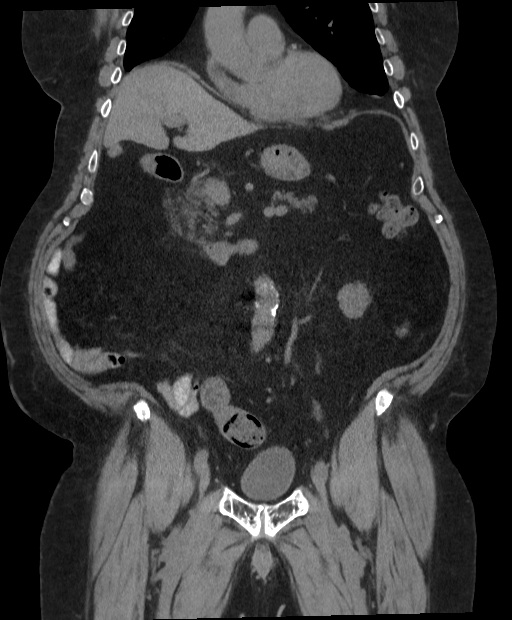
[im 97/174  soft-tissue]
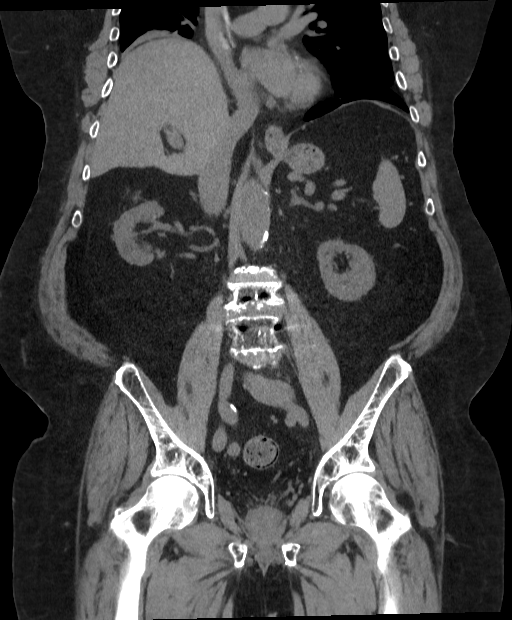

[15 of 46 positions shown; findings below may reference images not displayed]

FINDINGS: LUNG BASES: Calcified granuloma and volume loss right lung base with elevation 
right hemidiaphragm. No acute consolidation. No pleural effusions. 
HEPATOBILIARY: Cyst dome of the right lobe. Non contrast images show no mass or 
biliary dilatation. No gallstones. 
SPLEEN: Normal in size. 
PANCREAS: No evidence for ductal dilatation or mass.   
ADRENALS: No mass. 
GENITOURINARY: Cysts. No hydronephrosis or kidney stones. Bladder is 
decompressed. 
LYMPH NODES: No adenopathy. 
STOMACH, SMALL BOWEL AND COLON: No bowel wall thickening or obstruction. 
Moderate amount of stool within the colon. No pericolonic inflammatory process 
or collection. Right hemicolectomy. No significant focal wall thickening at the 
anastomosis. No evidence of adenopathy in the mesentery. 
VASCULAR STRUCTURES: Mild fusiform dilatation infrarenal abdominal aorta 
measuring 2.8 cm is greatest AP diameter. Ectatic iliac arteries..  
MUSCULOSKELETAL: No acute osseous abnormality. Scattered degenerative changes.  
ADDITIONAL FINDINGS: Broad-based ventral hernia containing transverse colon. 
Hernia neck measures approximately 9 cm. No evidence of obstruction at the level 
of hernia. Diastases of the rectus musculature..
IMPRESSION: No evidence of obstruction. Contrast is noted within the colon. Postsurgical 
change from right colectomy with no evidence of focal wall thickening at the 
anastomosis. No adenopathy in the adjacent mesentery. 
Moderate amount of stool within the colon but no pericolonic inflammatory 
process or collection. 
Hepatic and renal cysts. 
No nephrolithiasis or hydronephrosis. 
Broad-based ventral hernia containing transverse colon but no evidence of focal 
wall thickening. 
Eventration right hemidiaphragm with associated bibasilar atelectasis. 
RADIATION DOSE REDUCTION: All CT scans are performed using radiation dose 
reduction techniques, when applicable.  Technical factors are evaluated and 
adjusted to ensure appropriate moderation of exposure.  Automated dose 
management technology is applied to adjust the radiation doses to minimize 
exposure while achieving diagnostic quality images.

## 2022-07-13 IMAGING — MR MRI LUMBAR SPINE W/WO CONTRAST
6 of 12 series · 11 of 48 positions shown · IV contrast (Gadolinium)
Comparison: No prior lumbar examination

________________________________________________________________________________________________ 
MRI LUMBAR SPINE W/WO CONTRAST, 07/13/2022 [DATE]: 
CLINICAL INDICATION: Radiculopathy
TECHNIQUE: Sagittal T1, Sagittal T2, Sagittal STIR, Axial T2, Axial T1, Enhanced 
sagittal T1, Enhanced fat suppressed sagittal T1, and Enhanced axial T1 MR 
images of the lumbar spine were performed with and without intravenous 
enhancement.  12.0 mL of Gadavist were injected intravenously. 3.0 mL of 
Gadavist was discarded.  Patient was scanned on a 1.5T magnet.

[Series 101: survey · axial · 10.0mm · 1.25mm/px · 1 of 10 slices shown]
[im 1/10]
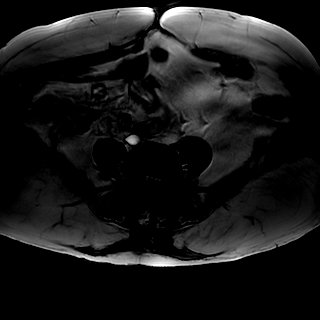

[Series 201: t2w_cor-surv · coronal · 6.0mm · 0.62mm/px · 1 of 10 slices shown]
[im 1/10]
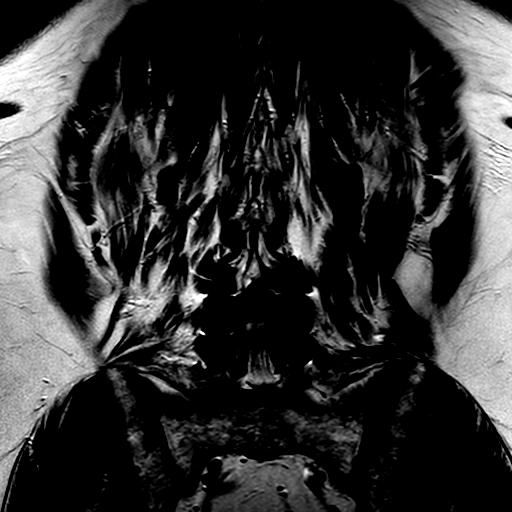

[Series 302: (id)_mdixon_tse · sagittal · 4.0mm · 0.31mm/px · 2 of 18 slices shown]
[im 1/18]
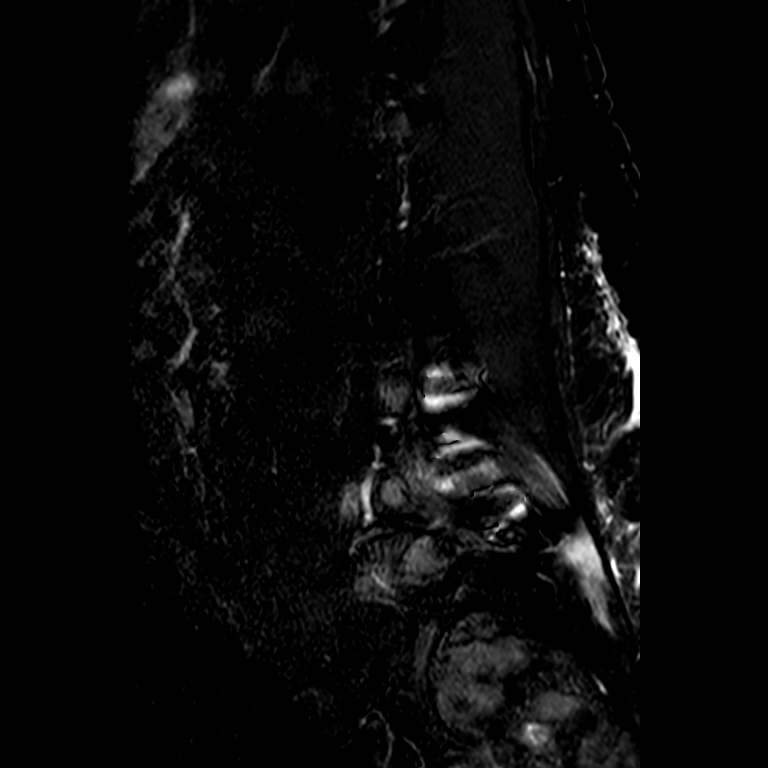
[im 18/18]
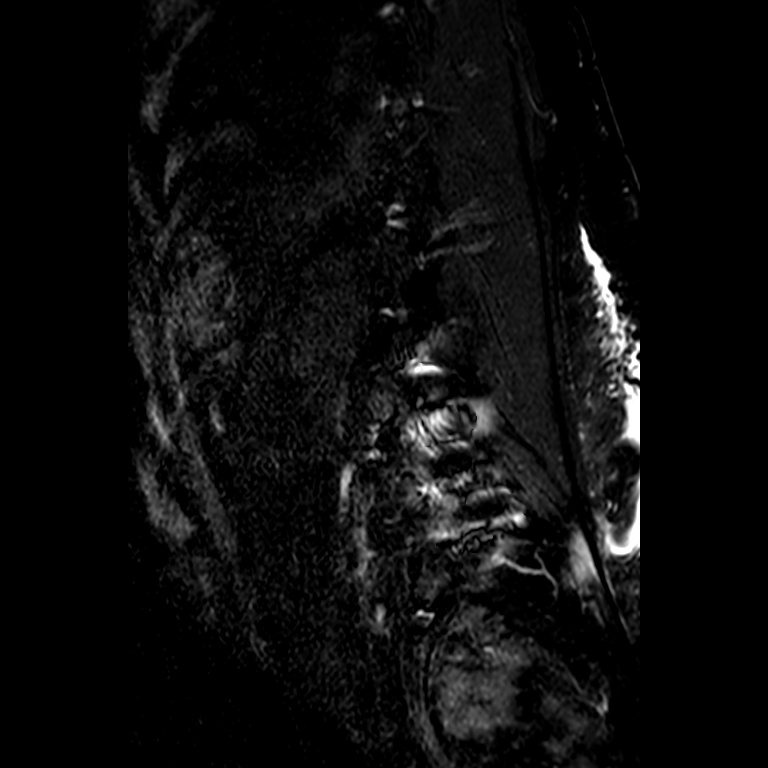

[Series 303: st2w_mdixon_tse · sagittal · 4.0mm · 0.31mm/px · 2 of 18 slices shown]
[im 1/18]
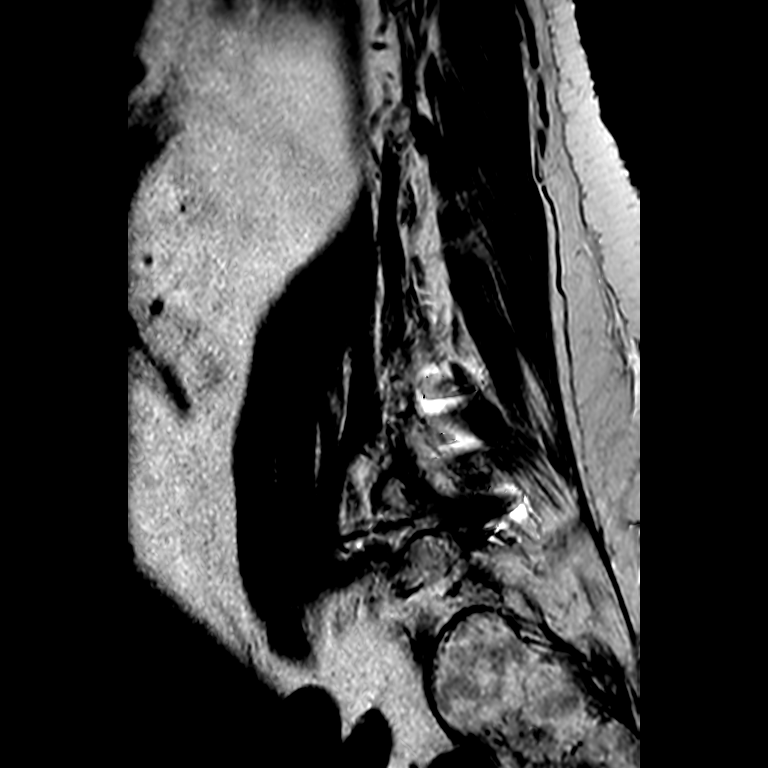
[im 18/18]
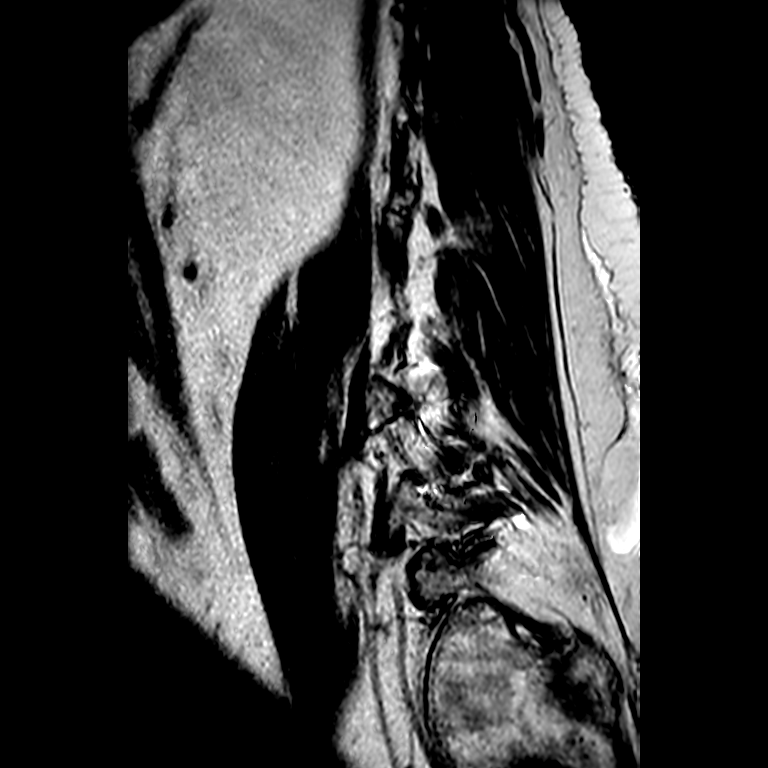

[Series 402: (id) view_ax mpr · axial · 1.0mm · 0.25mm/px · 1 of 139 slices shown]
[im 1/139]
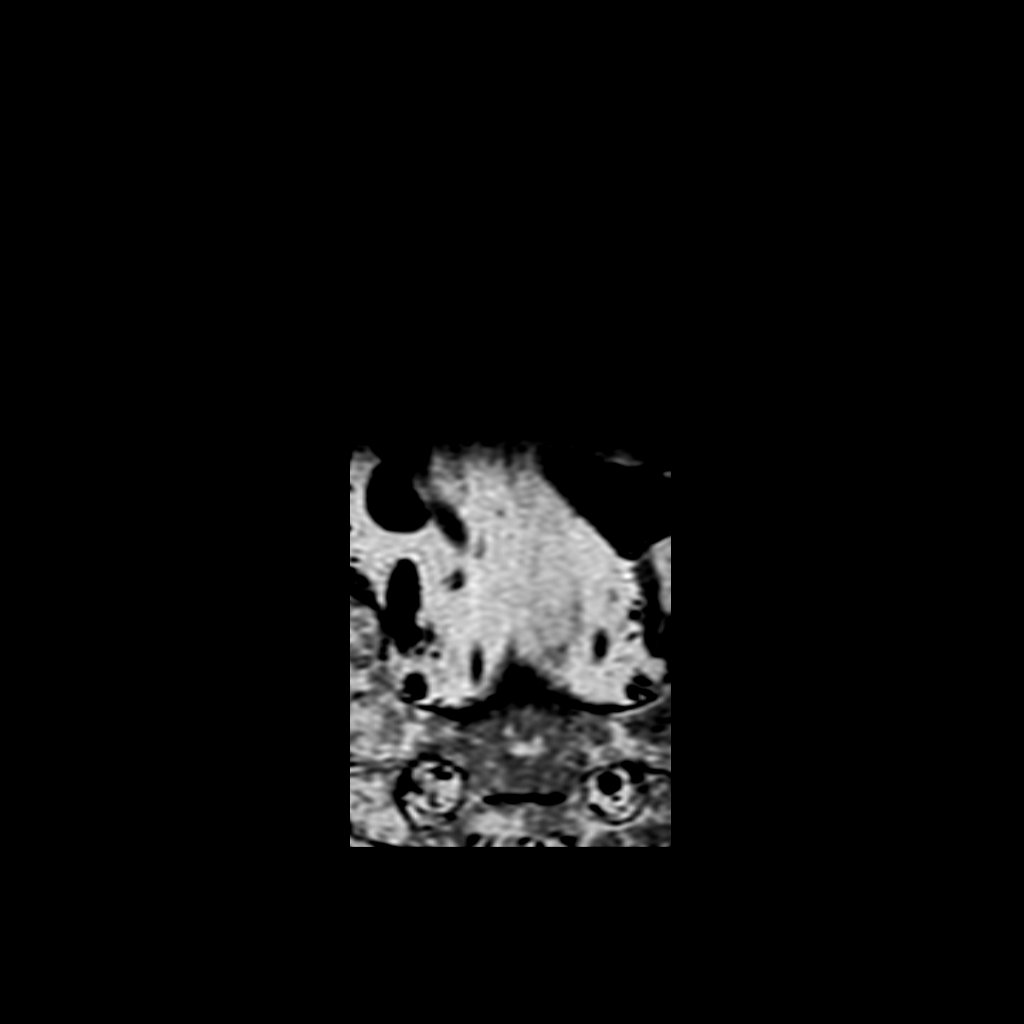

[Series 701: T1 · axial · 4.0mm · 0.33mm/px · z∈[-54,+121]mm · 4 of 42 slices shown]
[im 1/42]
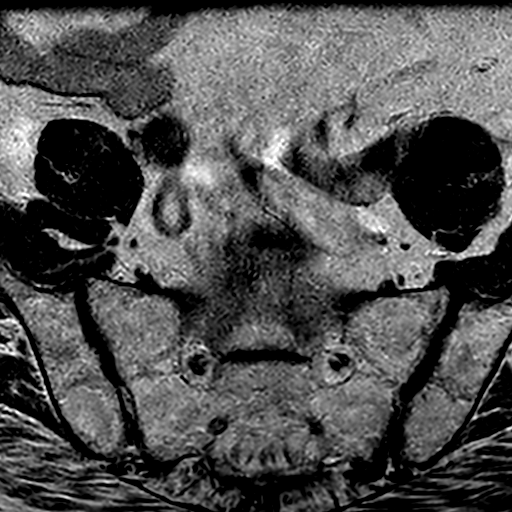
[im 14/42]
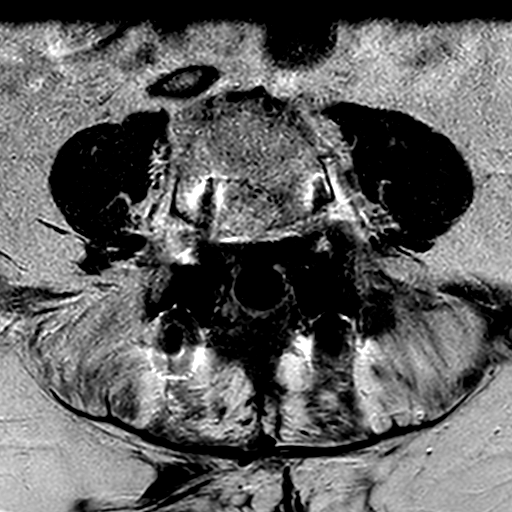
[im 28/42]
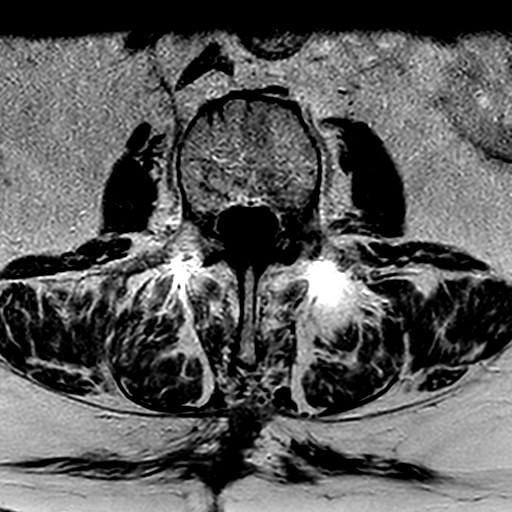
[im 42/42]
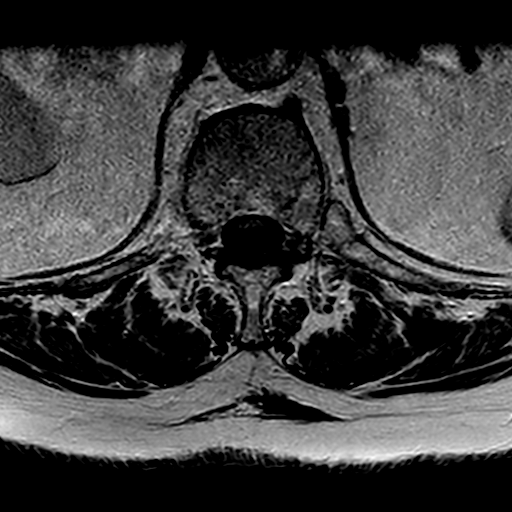

[11 of 48 positions shown; findings below may reference images not displayed]

FINDINGS: There is mild loss of height at L5 which appears chronic. There is a 
large Schmorls node with loss of height along the inferior L3 segment which 
appears chronic. Mild loss of height at T12. Bilateral pedicle screws from L3 
through L5 appear appropriately positioned. Todays CT findings reported 
separately. 
At L5-S1 there is slight retrolisthesis and mild disc bulge. The canal is open. 
There is marked right, moderate left foraminal stenosis. 
At L4-5 there has been posterior decompression. The canal is open. There is mild 
encroachment on the upper L5 lateral recesses, axial T2 image 10. There is 
marked bilateral foraminal stenosis. Less than grade 1 anterolisthesis at this 
level measures 4 mm. 
At L3-4 there appears to have been posterior decompression with ligament 
resection. There is no significant residual canal stenosis. There is mild 
narrowing of the upper L4 lateral recesses, axial T2 image 14. There is mild to 
moderate foraminal narrowing. 
At L2-3 there is 2 mm retrolisthesis and mild disc-osteophyte. Moderate facet 
and ligamentous hypertrophy. There is mild to moderate canal stenosis. There is 
impingement of the L3 lateral recesses bilaterally best seen on axial T2 images 
19 and 20. Stenosis also seen on the 1 mm axial image 76. There is moderate 
bilateral foraminal stenosis. 
At L1-2 the canal is borderline narrow. Foramina are open. 
There are postsurgical changes in the paraspinal soft tissues. There is no 
concerning osseous or intrathecal enhancement. 
The conus terminates opposite L1. There is mild lumbar dextroscoliosis. 
There are bilateral renal cysts which appear benign. 
On the coronal localizer there is concern for possible avascular necrosis of the 
femoral heads. Plain radiographs or MRI of the hips recommended for further 
evaluation.
IMPRESSION: Multilevel degenerative and postsurgical changes as described. There appears to 
be mild to moderate canal stenosis at L2-3 encroaching on the exiting L3 nerve 
roots bilaterally. 
Multilevel foraminal stenosis as described. 
Mild chronic compression fracture of L5. Large chronic Schmorls node at L3. 
Mild chronic loss of height at T12. 
Suspected bilateral femoral head osteonecrosis. Hip radiographs or MRI 
recommended. 
Mild lumbar dextroscoliosis. 
No concerning osseous or intrathecal enhancement. There is postsurgical change. 
L3-L5 pedicle screws appear appropriately positioned. 
No evidence for spinal malignancy. 
Todays CTA findings reported separately.

## 2022-07-13 IMAGING — CT CT LUMBAR SPINE WITHOUT CONTRAST
4 series · 13 of 27 positions shown, 15 images · non-contrast
Comparison: 

________________________________________________________________________________________________ 
CT LUMBAR SPINE WITHOUT CONTRAST, 07/13/2022 [DATE]: 
CLINICAL INDICATION: Low back and right leg pain. Prior lumbar surgery 1311, 
history multiple myeloma 
A search for DICOM formatted images was conducted for prior CT imaging studies 
completed at a non-affiliated media free facility.
TECHNIQUE: The lumbar spine was scanned from T12 through mid-sacrum without 
contrast on a high-resolution CT scanner using dose reduction techniques. 
Routing MPR reconstructions were performed. 3-D renderings were reconstructed on 
an independent workstation with concurrent physician supervision.

[Series 4: axial · axial · 0.36mm/px · z∈[-508,-368]mm · 3 of 281 slices shown, 4 images]
[im 71/281  soft-tissue]
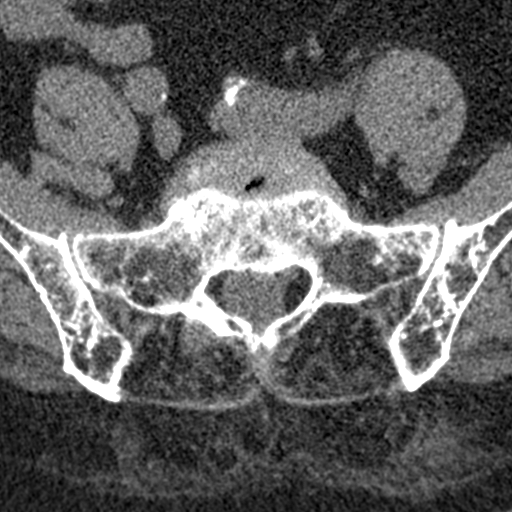
[im 71/281  bone]
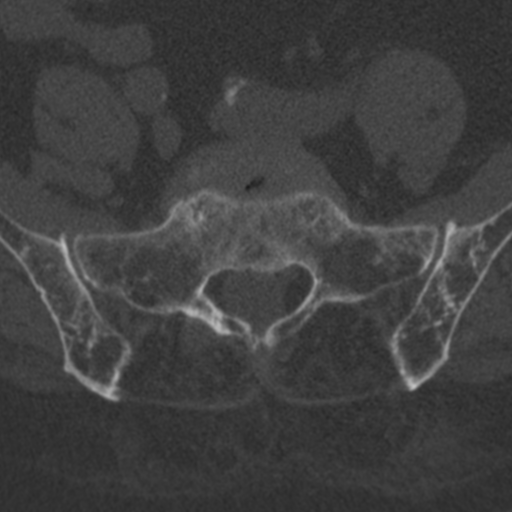
[im 141/281  bone]
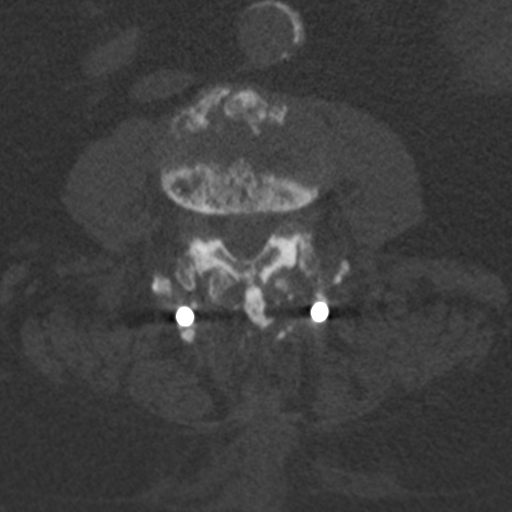
[im 211/281  bone]
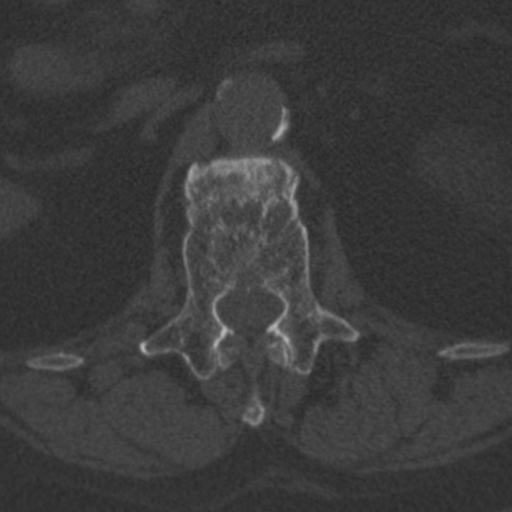

[Series 6: axial (person_name) · axial · 0.36mm/px · z∈[-508,-368]mm · 3 of 282 slices shown]
[im 71/282  bone]
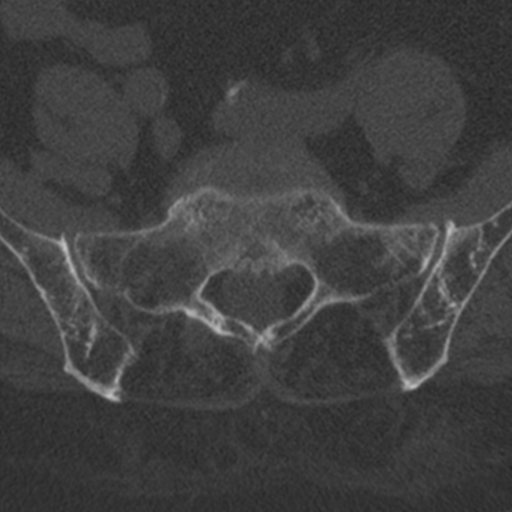
[im 141/282  bone]
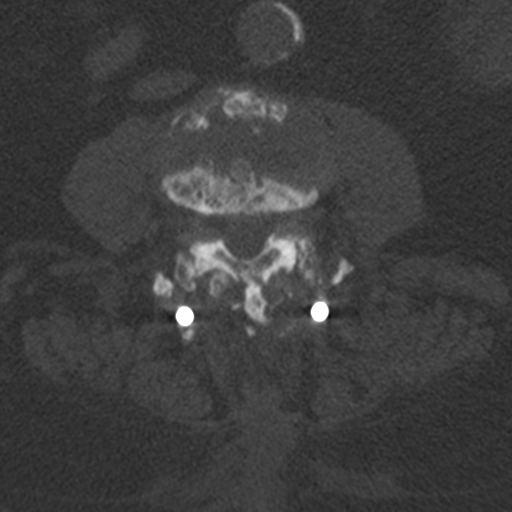
[im 211/282  bone]
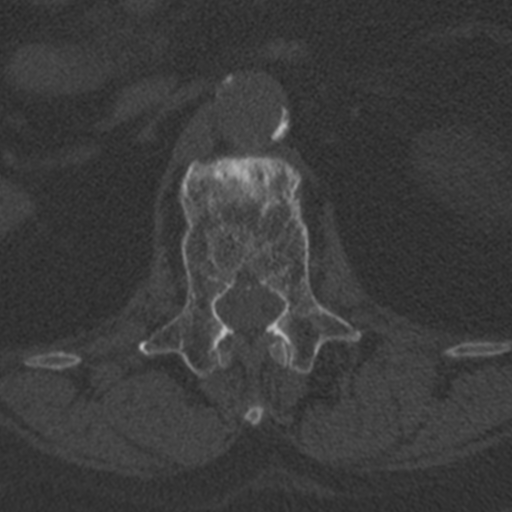

[Series 8: sag · sagittal · 0.40mm/px · 5 of 164 slices shown, 6 images]
[im 55/164  bone]
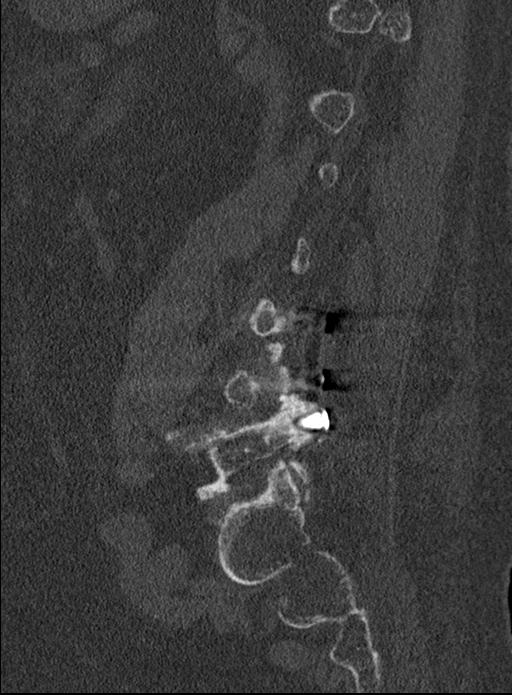
[im 68/164  bone]
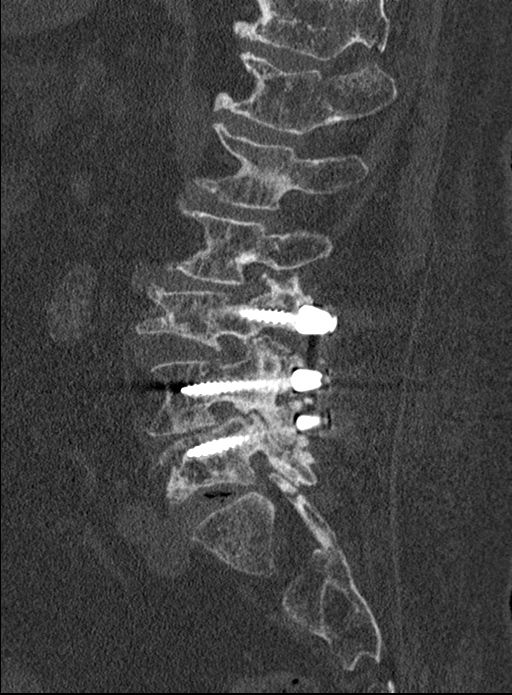
[im 82/164  soft-tissue]
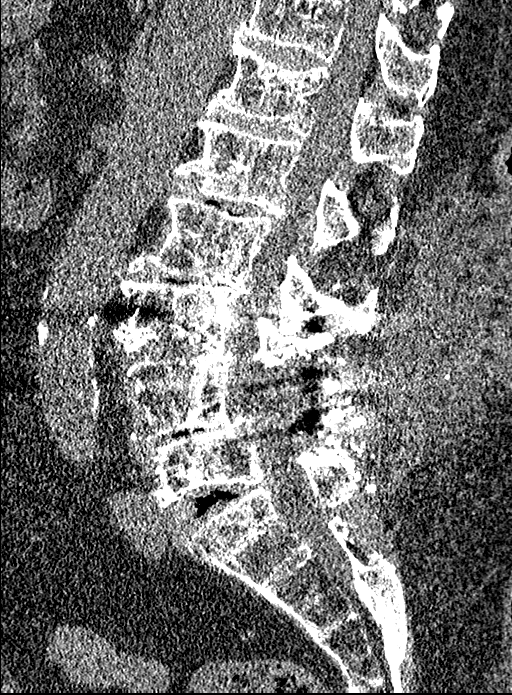
[im 82/164  bone]
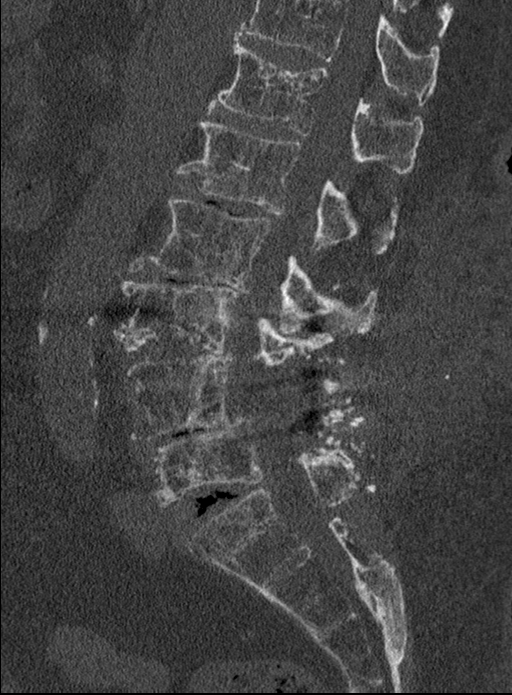
[im 96/164  bone]
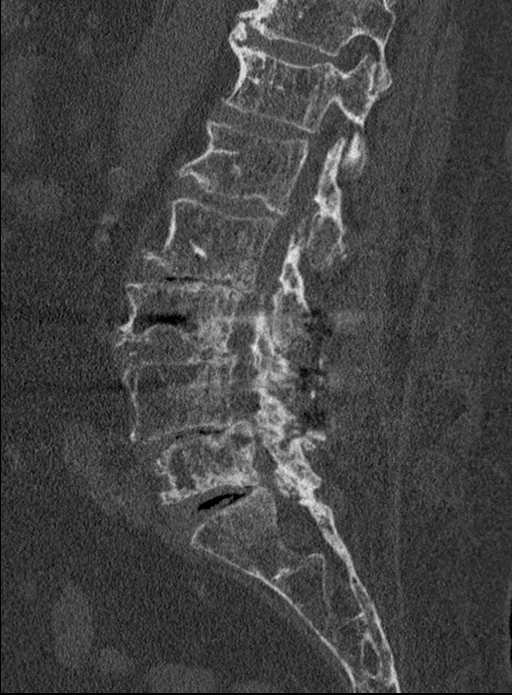
[im 109/164  bone]
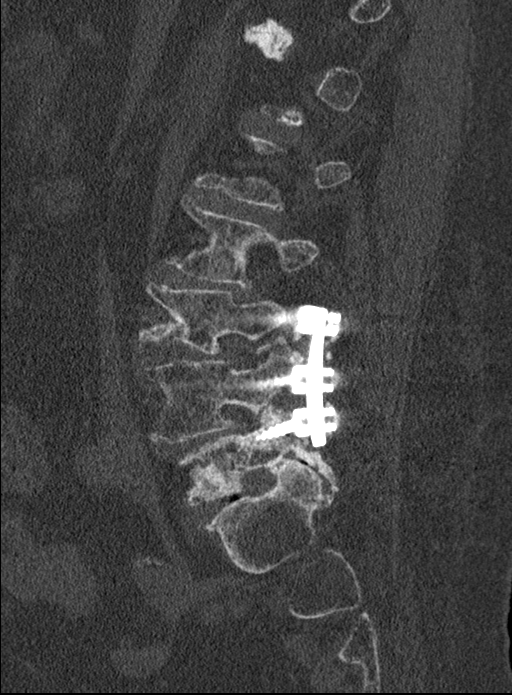

[Series 11: st multi (person_name) · axial · 0.32mm/px · z∈[-457,-355]mm · 2 of 263 slices shown]
[im 88/263  bone]
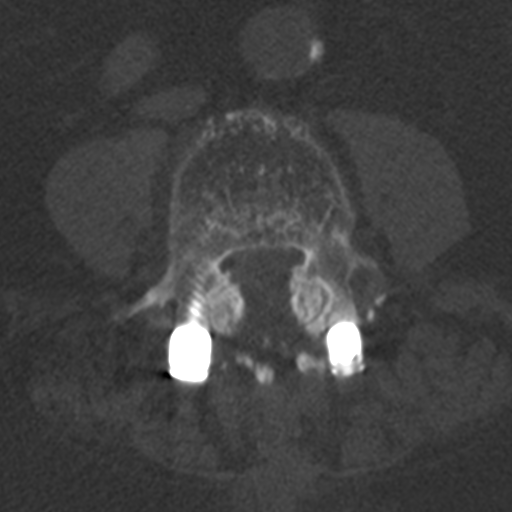
[im 175/263  bone]
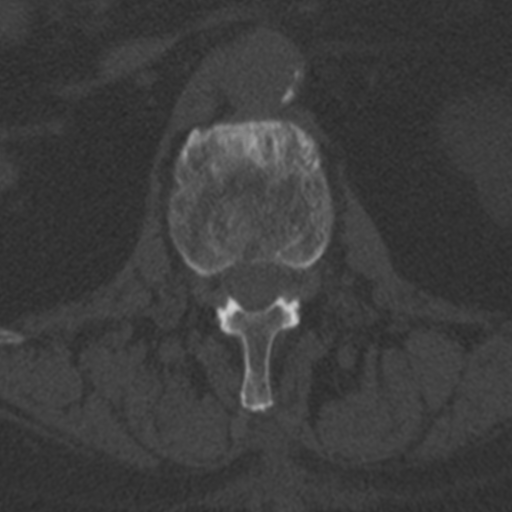

[13 of 27 positions shown; findings below may reference images not displayed]

FINDINGS: There is suboptimal scan detail due to patient body habitus. There is 
a large Schmorls node along the inferior L3 endplate, and mild loss of height 
at L5. Slight anterolisthesis at L3-4 and L4-5. 
On axial bone window images there are lucencies in the posterior medial iliac 
bones bilaterally, and there our other small lucencies in the vertebral bodies 
from L3 through S1 which could indicate involvement by myeloma. 
There are bilateral pedicle screws from L3 through L5 which appear appropriately 
positioned. No evidence for hardware loosening or failure. 
At L5-S1 scan detail is limited. There is no significant canal stenosis. There 
is moderate facet degenerative change, and moderate-marked bilateral foraminal 
stenosis. 
At L4-5 there is evidence of prior bilateral laminectomy. There does not appear 
to be significant canal stenosis. There is mild narrowing of the upper L5 
lateral recesses, axial image 180. There is mild to moderate bilateral foraminal 
stenosis. 
At L3-4 there is marked facet hypertrophy. There appears to be mild- moderate 
canal stenosis, axial image 147. There appears to be encroachment on the upper 
L4 lateral recesses bilaterally. There is moderate right, mild to moderate left 
foraminal stenosis. 
At L2-3 there appears to be mild-moderate canal stenosis. On axial image 122 
there appears to be encroachment on the upper L3 lateral recesses due to disc 
bulge and ligamentous thickening. Soft tissue detail is limited. There is 
moderate left, mild right foraminal stenosis. 
At L1-2 the canal and foramina are open. 
At T12-L1 the canal and foramina are open.
IMPRESSION: Todays examination shows limited detail due to patient body habitus. There are 
lucencies in the lower vertebral segments and posterior medial iliac bones which 
could indicate involvement by multiple myeloma. Enhanced lumbar MRI would be 
useful for further evaluation if indicated. 
There is mild loss of height of L5 without discrete fracture line. Large 
Schmorls node along the inferior L3 endplate. 
Bilateral pedicle screws L3-L5 appear appropriately positioned without loosening 
or failure. 
Degenerative changes. There appears to be mild to moderate canal stenosis at 
L3-4 and L2-3 as described. Slight anterolisthesis at L3-4. 
Bilateral laminectomy at L4-5. Mild narrowing of the upper L5 lateral recesses. 
There is less than grade 1 anterolisthesis at L4-5. 
Multilevel foraminal stenosis. 
Mild lumbar dextroscoliosis. 
Moderate SI joint degenerative changes. 
Mild aortic plaque. No retroperitoneal adenopathy. 
RADIATION DOSE REDUCTION: All CT scans are performed using radiation dose 
reduction techniques, when applicable.  Technical factors are evaluated and 
adjusted to ensure appropriate moderation of exposure.  Automated dose 
management technology is applied to adjust the radiation doses to minimize 
exposure while achieving diagnostic quality images.

## 2022-08-02 IMAGING — MR MRI PELVIS WITHOUT CONTRAST
4 of 6 series · 13 of 48 positions shown · IV contrast (gadolinium)
Comparison: 07/13/2022 lumbar spine scout imaging through the femoral heads.

________________________________________________________________________________________________ 
MRI PELVIS WITHOUT CONTRAST, 08/02/2022 [DATE]: 
CLINICAL INDICATION: Idiopathic aseptic necrosis of the left femur.
TECHNIQUE: Multiplanar, multiecho position MR images of the pelvis were 
performed without intravenous gadolinium enhancement. Patient was scanned on a 
1.5T magnet.

[Series 101: survey · axial · 10.0mm · 1.25mm/px · z∈[-34,+200]mm · 2 of 11 slices shown]
[im 1/11]
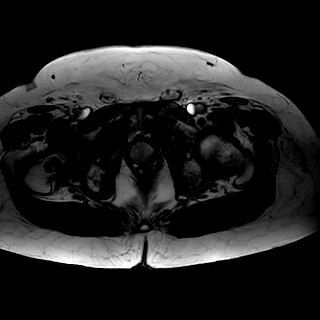
[im 11/11]
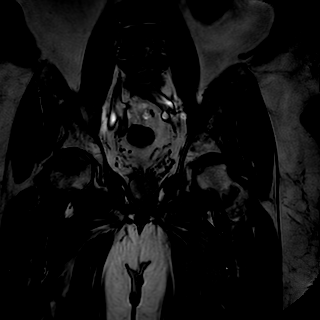

[Series 201: t1_(person_name) · axial · 6.0mm · 0.44mm/px · z∈[-131,+109]mm · 5 of 36 slices shown]
[im 1/36]
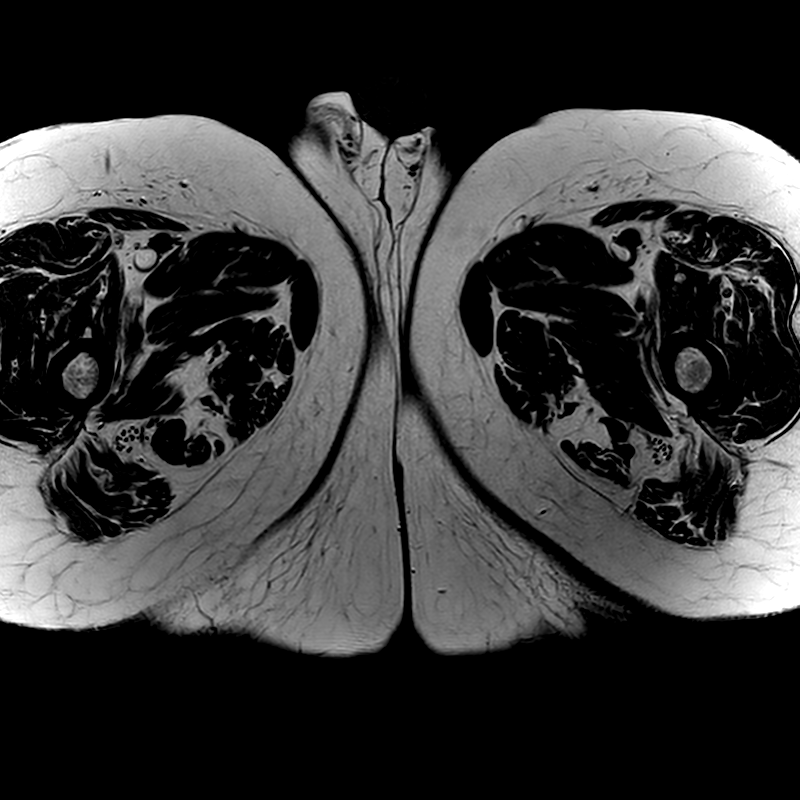
[im 6/36]
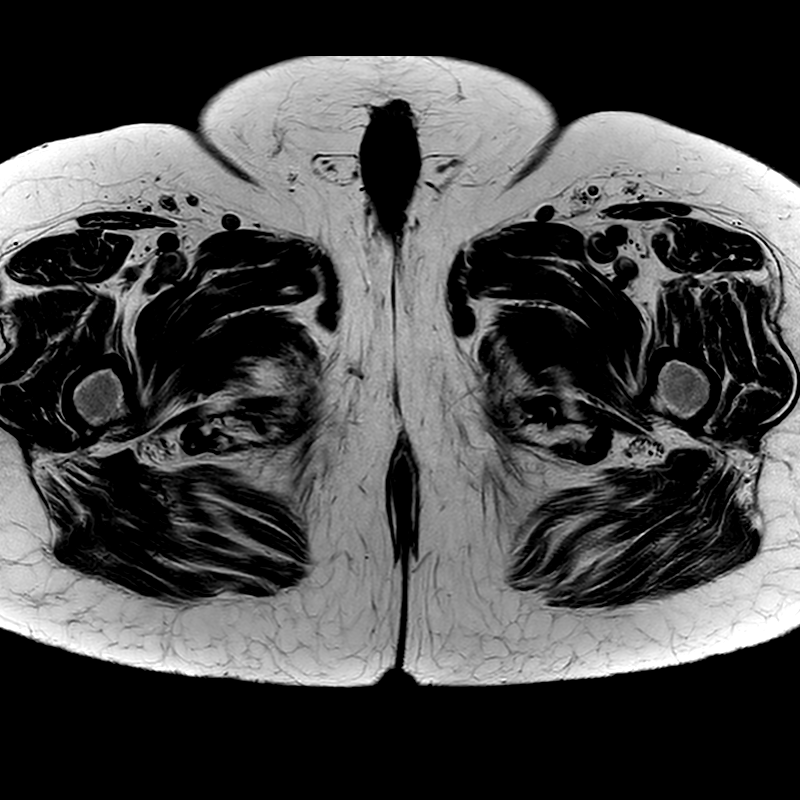
[im 11/36]
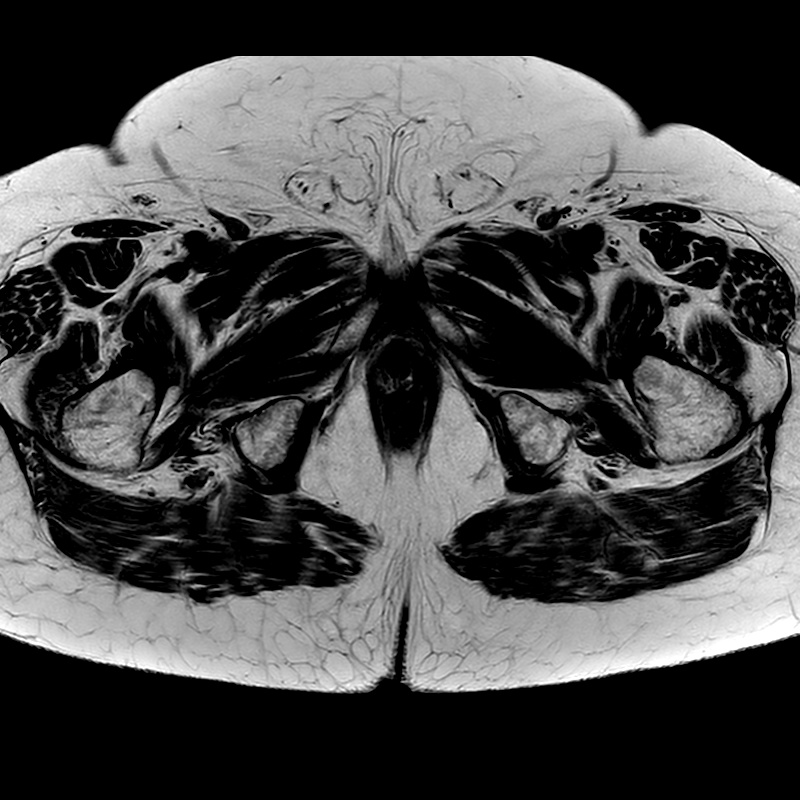
[im 21/36]
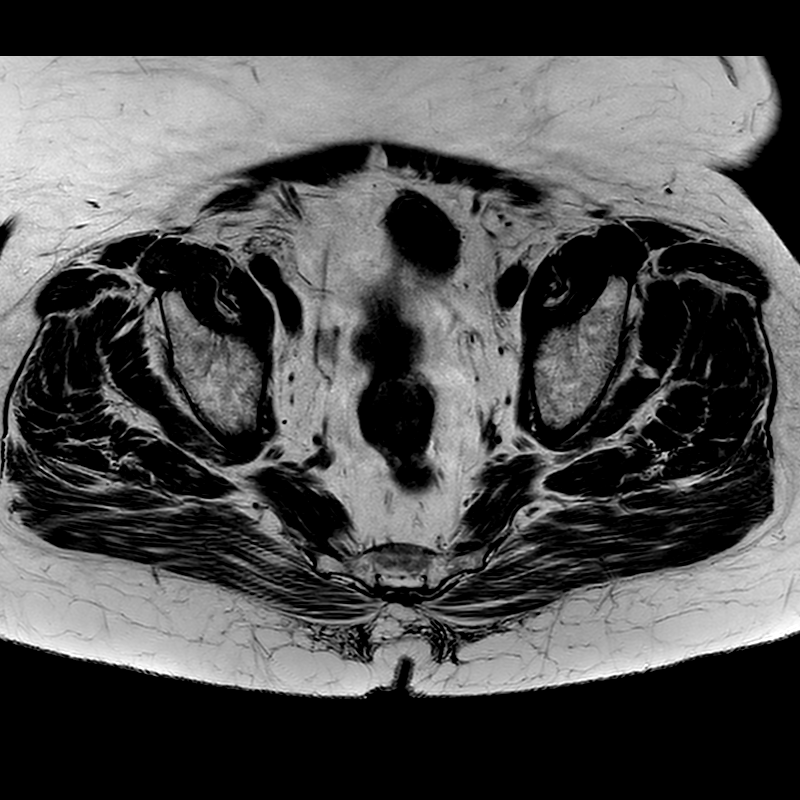
[im 31/36]
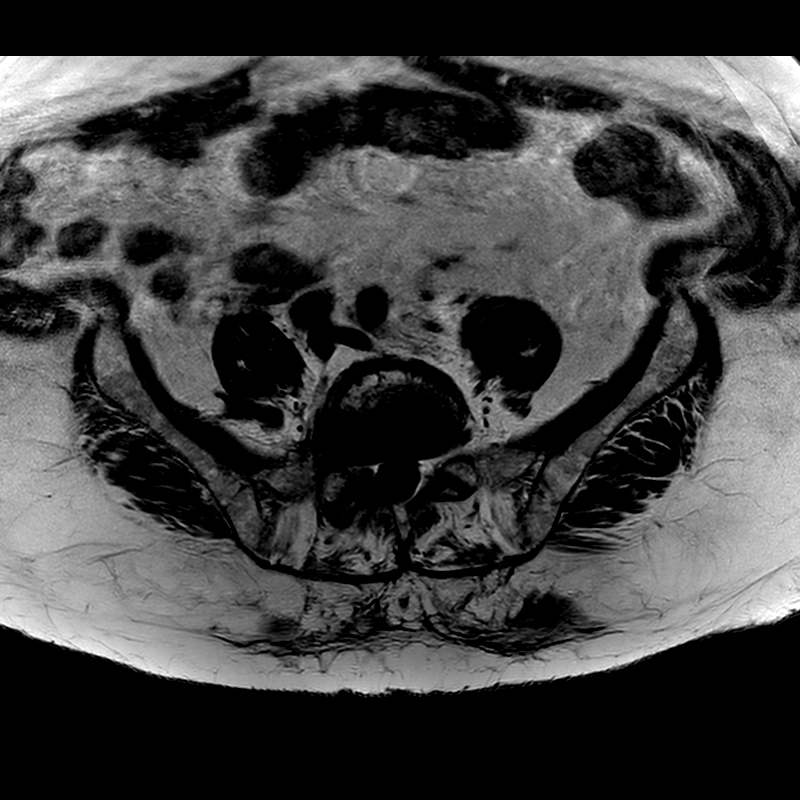

[Series 301: (person_name)_(person_name)_(person_name) · axial · 6.0mm · 0.68mm/px · z∈[-99,+109]mm · 3 of 36 slices shown]
[im 5/36]
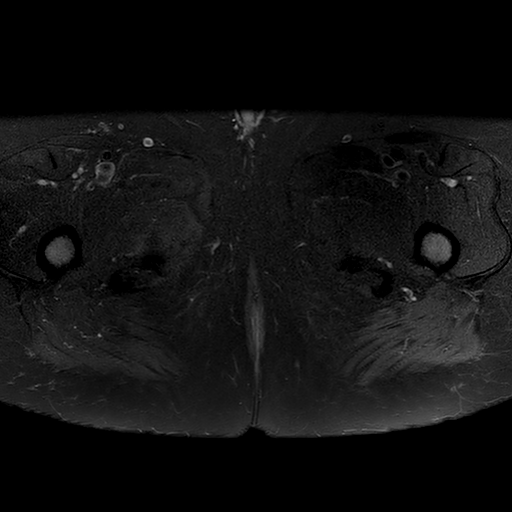
[im 18/36]
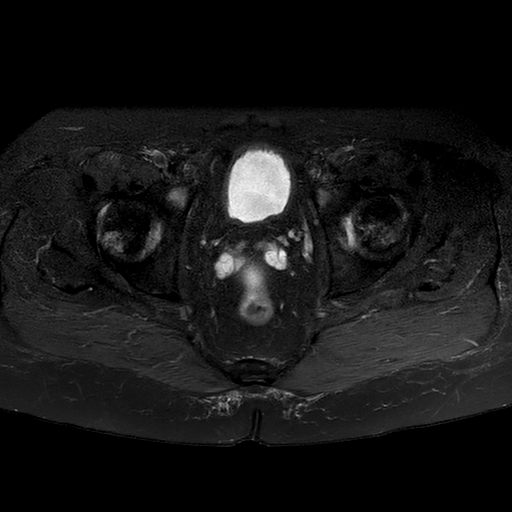
[im 31/36]
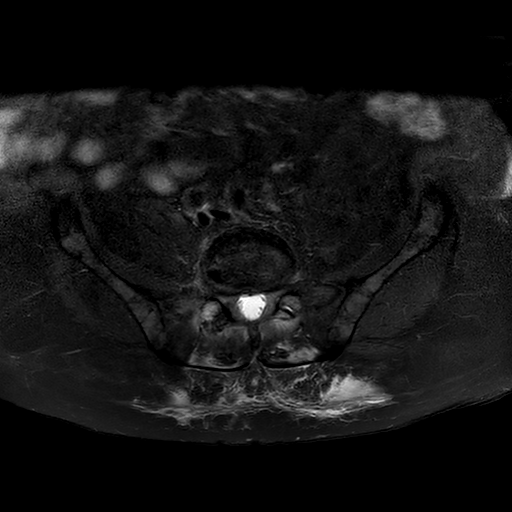

[Series 401: stir_cor-pelvis · coronal · 5.0mm · 0.66mm/px · 3 of 33 slices shown]
[im 5/33]
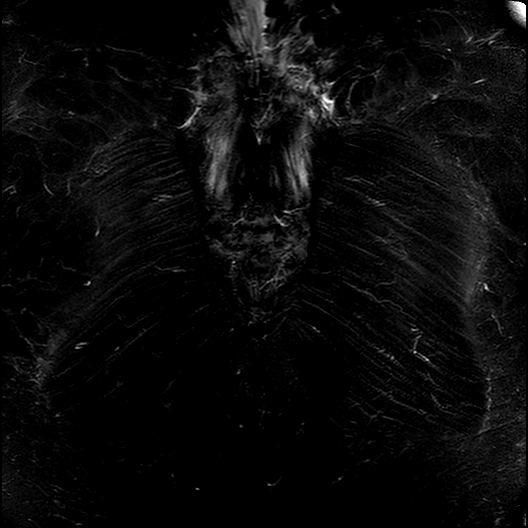
[im 19/33]
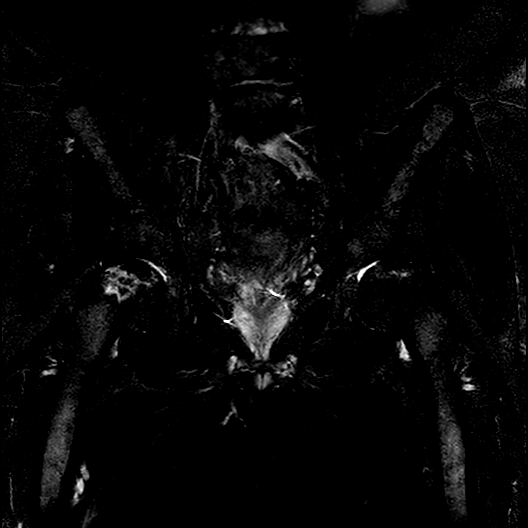
[im 28/33]
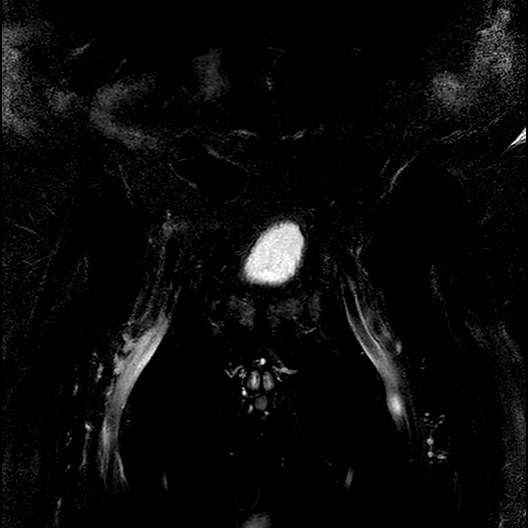

[13 of 48 positions shown; findings below may reference images not displayed]

FINDINGS: HIPS: Chronic osteonecrosis of the entire weightbearing surfaces of the femoral 
heads, more extensive on the right (5.1 x 4.3 cm on the right and 4.7 x 3.8 cm 
on the left). No articular collapse.  No discrete articular cartilaginous loss 
of either hip. Tiny acetabular osteophytes. Degenerative anterior labral tears. 
No paralabral cyst. No hip joint effusion.  
BONES: Normal marrow signal intensity of the proximal hips, pelvis, sacrum and 
included lower lumbar spine. No fracture, contusion or marrow replacing lesion. 
1.6 cm right iliac body bone island. Mild degenerative change of the SI joints 
and pubic symphysis. Chronic mild L5 vertebral body compression fracture and 
multifocal postsurgical and degenerative change. 
SOFT TISSUES: Partial-thickness tears of the hamstring tendon origins, more 
extensive on the left. The bilateral abductor cuffs are preserved. The 
insertions of the iliopsoas tendons are intact. Rectus abdominis-adductor 
complex is preserved. The musculature is symmetric without strain, atrophy or 
mass. No focal fluid collection or distended bursa. Specifically, no iliopsoas 
or trochanteric bursitis. Included neurovascular bundles are negative. 
Intrapelvic contents are negative.
IMPRESSION: 1.  Chronic osteonecrosis of the entire weightbearing surfaces of the femoral 
heads, more extensive on the right. No articular collapse.   
2.  Mild degenerative change of the hips and anterior labral tears.  
3.  Partial-thickness tears of the hamstring tendon origins, more extensive on 
the left.  
4.  Mild degenerative change of the SI joints and pubic symphysis. 
5.  Chronic mild L5 vertebral body compression fracture and multifocal 
postsurgical and degenerative change: Please refer to 07/13/2022 MRI lumbar 
spine dictation.

## 2022-08-18 IMAGING — PT PET CT SCAN TUMOR IMAGING WHOLE BODY
1 of 6 series · 5 of 25 positions shown · non-contrast
Comparison: CT abdomen pelvis April 29, 2022.

________________________________________________________________________________________________ 
******** ADDENDUM #1 ********/n 
Prior PET scan from June 2021 and January 2021 now available. Unfortunately 
unable to load the PET portion to the exam. By report however the PET portions 
were negative. The CT portions show postoperative changes in the spine as well 
as chronic diffuse osteopenia and lucencies especially seen within the pelvis. 
Overall these findings are not thought to be significantly changed. No new areas 
of FDG activity identified. Lytic lucency involving the inferior sternum is 
again seen and unchanged compared to prior exam. Stable vertebral plana 
appearance of T7. 
Stable appearance. This is especially true on CT. Unable to load the prior PET 
images though by report these were negative. 
PET CT SCAN TUMOR IMAGING WHOLE BODY, 08/18/2022 [DATE]: 
CLINICAL INDICATION:  Multiple myeloma.
TECHNIQUE: A dose of 14.7 millicuries of 18-FDG was administered intravenously 
and total body PET scanning was performed at 54 minutes. Tomographic scans were 
reconstructed in axial, coronal, and sagittal projections. The data was 
reconstructed into a three-dimensional volume rendered image and reviewed in a 
rotational cine loop. Serum blood glucose at the time of injection was 120 
mg/dl.

[Series 4: ct wb · axial · 4.0mm · 0.98mm/px · z∈[-1466,-222]mm · 5 of 461 slices shown]
[im 77/461  soft-tissue]
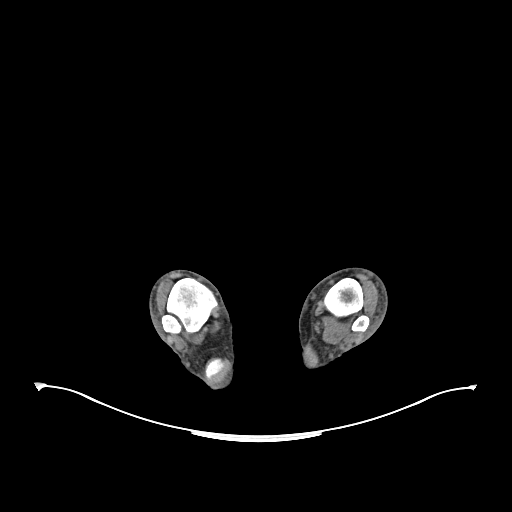
[im 154/461  soft-tissue]
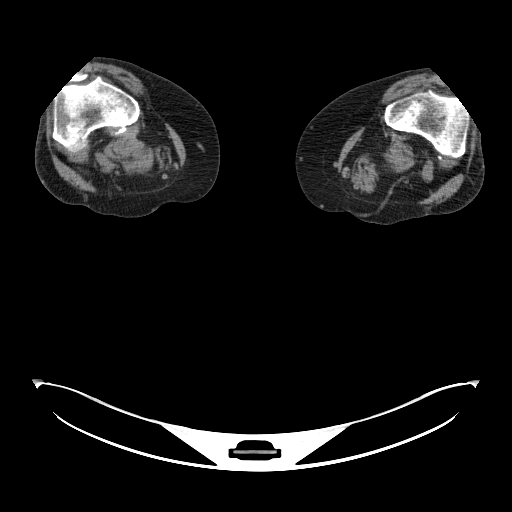
[im 231/461  soft-tissue]
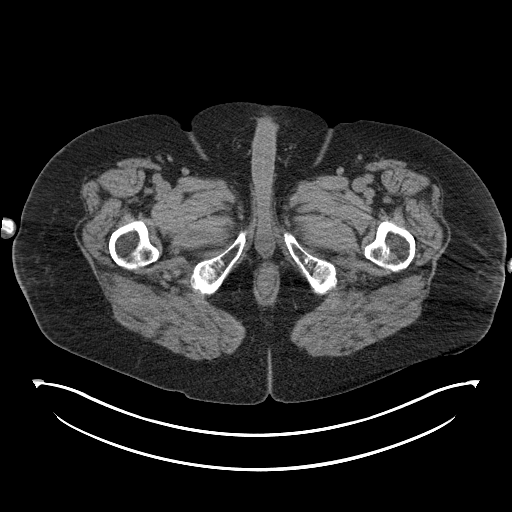
[im 307/461  soft-tissue]
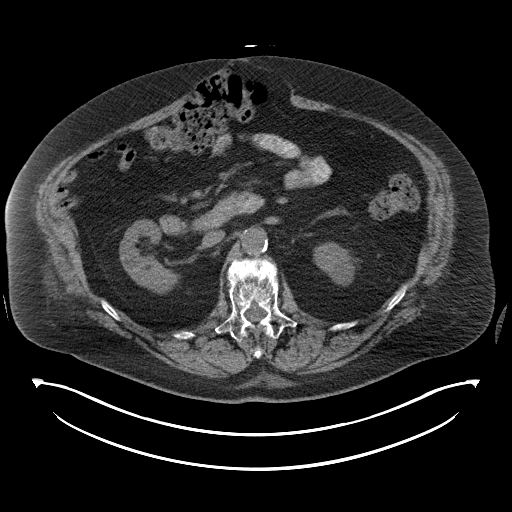
[im 384/461  soft-tissue]
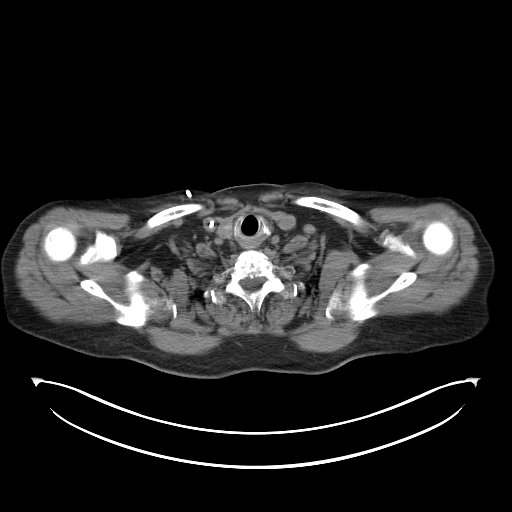

[5 of 25 positions shown; findings below may reference images not displayed]

FINDINGS: There is a coarsened trabecular pattern and at a few location a few tiny 
skeletal lucencies compatible with history of multiple myeloma. No suspicious 
focal FDG avid skeletal lesions are detected. 
There is focal high-level FDG avidity involving the prostate gland raising the 
possibility of incidental prostatic malignancy. If PSA is elevated consider 
follow-up prostate MRI imaging. 
Benign inflammatory FDG activity is in the RIGHT knee joint. There is skeletal 
muscle activity in the forearms and hands. Degenerative uptake is in the LEFT 
sternoclavicular joint. The PET images are otherwise unremarkable. 
A RIGHT chest Mediport is incidentally present. Mild coronary artery 
calcifications are present. Renal cysts are incidentally noted. There is 
dehiscence of the [place] with a [place] ventral hernia containing 
intraperitoneal fat and segments of the large and small bowel. There has been 
RIGHT hemicolectomy. There is surgical fusion hardware in the lumbar spine.
IMPRESSION: 1. There is coarsening of the trabecular pattern and at a few locations, tiny 
skeletal lucencies compatible with diagnosis of multiple myeloma. No associated 
FDG avid skeletal lesions are found. 
2. There is focal elevated FDG avidity involving the prostate gland raising the 
possibility of incidental prostatic malignancy. If PSA is elevated consider 
follow-up prostate MRI imaging.

## 2022-10-31 IMAGING — CT CT CHEST WITHOUT CONTRAST
1 of 3 series · 12 of 32 positions shown, 15 images · non-contrast
Comparison: PET/CT exam of 08/18/2022 and dating back to 02/12/2021

________________________________________________________________________________________________ 
CT CHEST WITHOUT CONTRAST, 10/31/2022 [DATE]: 
CLINICAL INDICATION: Follow-up pneumonia. History of multiple myeloma and 
prostate cancer. 
A search for DICOM formatted images was conducted for prior CT imaging studies 
completed at a non-affiliated media free facility.
TECHNIQUE: The chest was scanned from base of neck through the lung bases 
without contrast on a high resolution CT scanner. Routine MPR reconstructions 
were performed. Count of known CT and Cardiac Nuclear Medicine studies performed 
in the previous 12 months = 1

[Series 3: chest wo lung · axial · 0.96mm/px · z∈[+224,+599]mm · 12 of 153 slices shown, 15 images]
[im 14/153  mediastinal]
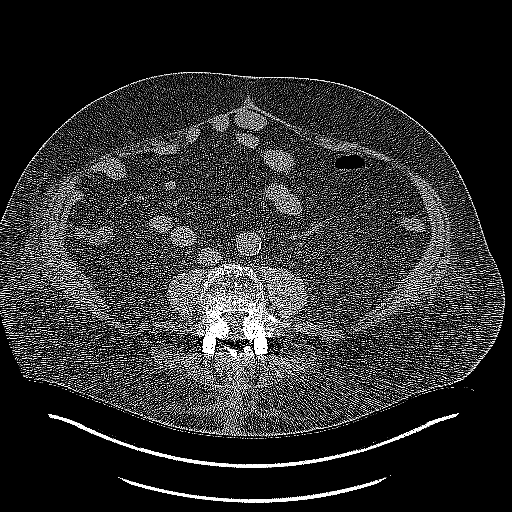
[im 14/153  lung]
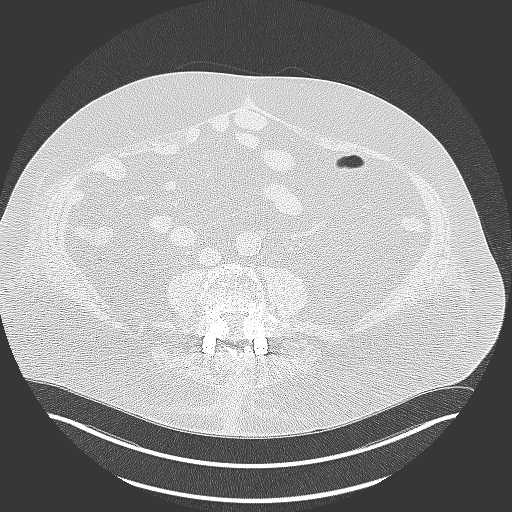
[im 28/153  lung]
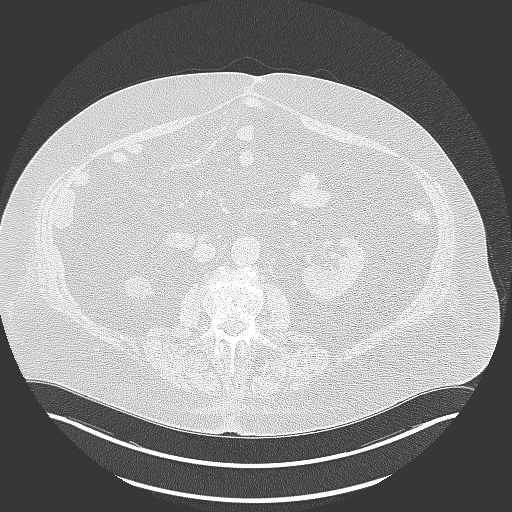
[im 42/153  lung]
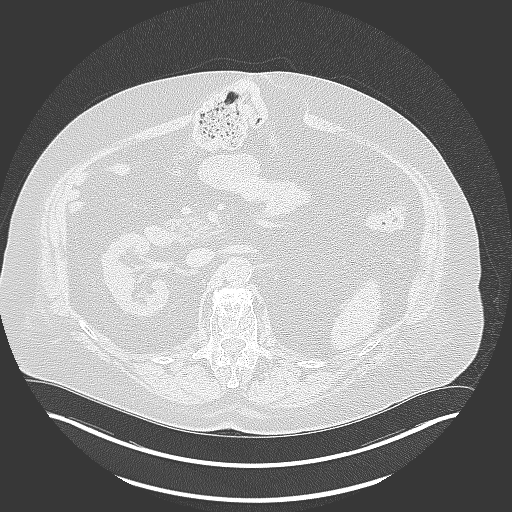
[im 56/153  lung]
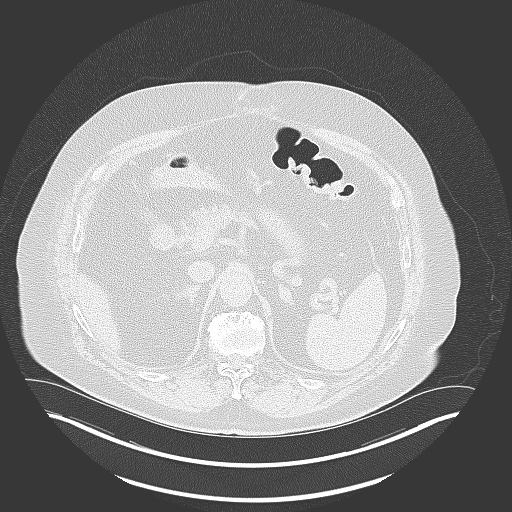
[im 70/153  mediastinal]
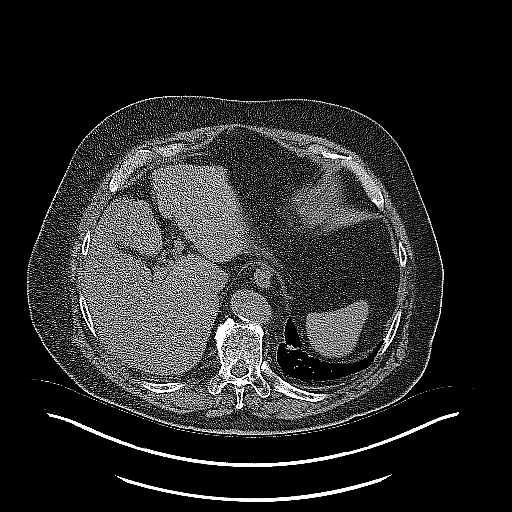
[im 70/153  lung]
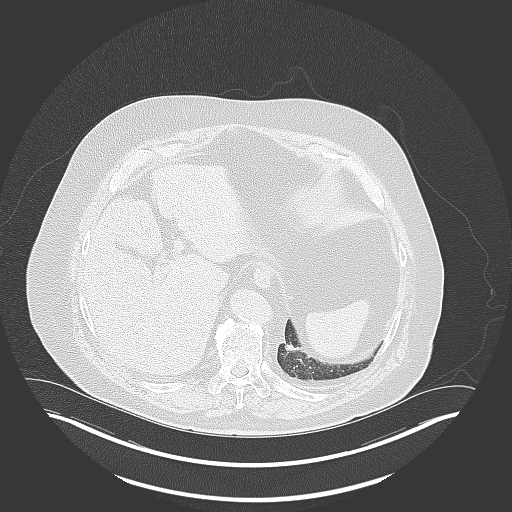
[im 77/153  lung]
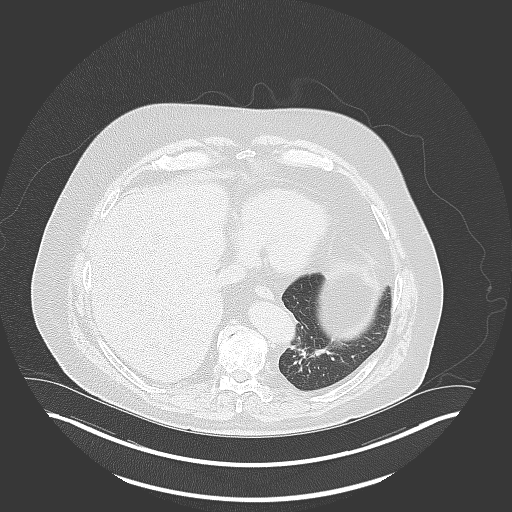
[im 78/153  lung]
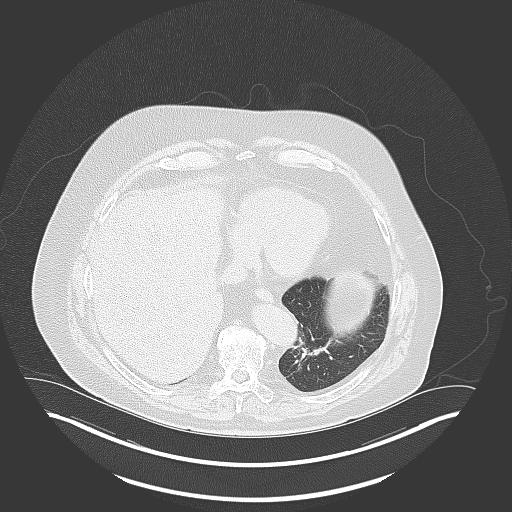
[im 83/153  lung]
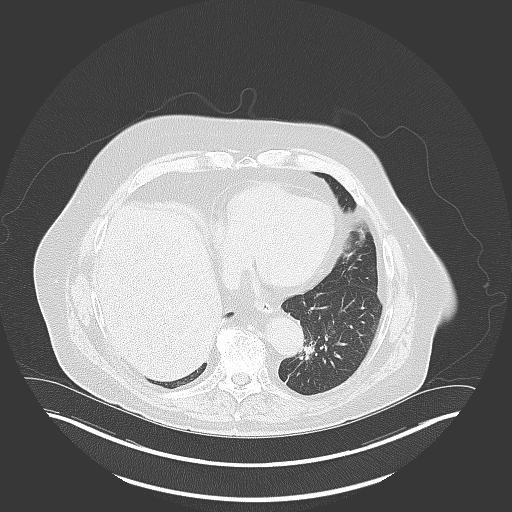
[im 97/153  mediastinal]
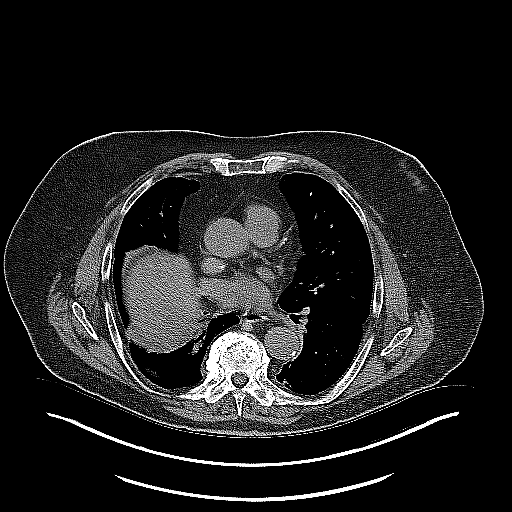
[im 97/153  lung]
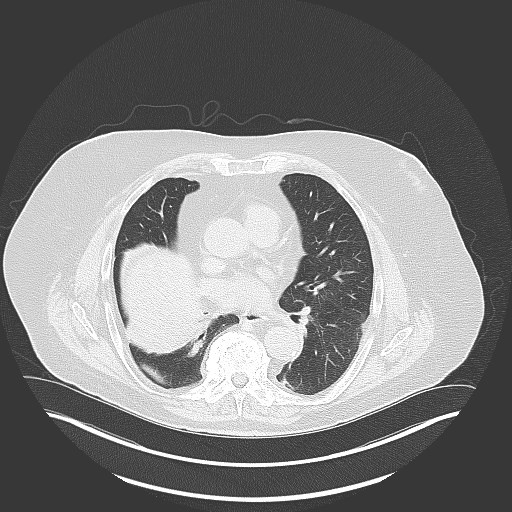
[im 111/153  lung]
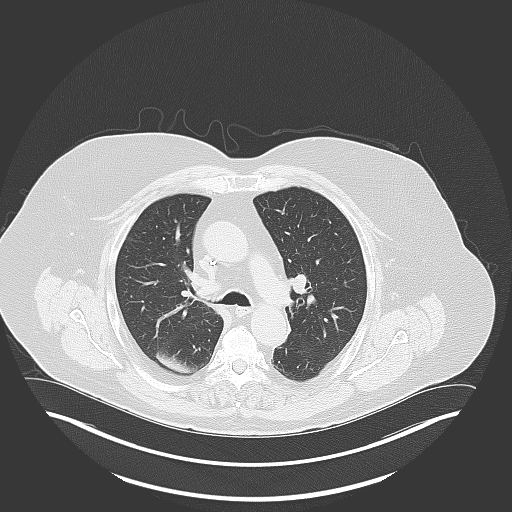
[im 125/153  lung]
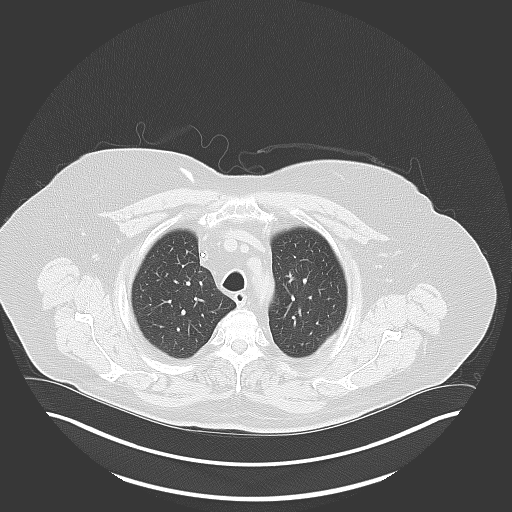
[im 139/153  lung]
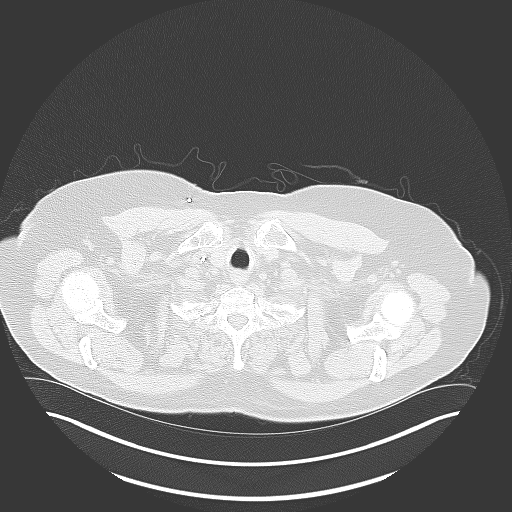

[12 of 32 positions shown; findings below may reference images not displayed]

FINDINGS: LUNGS AND PLEURA:  Bibasilar fibroatelectasis is again seen, patient motion 
obscures some detail on the CT portion of the prior PET CT exam. No effusion. 
Tracheobronchial tree is negative. 
MEDIASTINUM:  No adenopathy. Normal heart size. No pericardial effusion.  
Is arterial calcifications including mild coronary arterial calcifications. 
CHEST WALL/AXILLA: No mass or adenopathy. Right-sided Port-A-Cath. 
UPPER ABDOMEN: Included portions of the upper abdomen including a hepatic cyst. 
MUSCULOSKELETAL: Osteopenia. Scattered degenerative changes. Left paramedian 
sternal lytic lesion is unchanged. No discrete new lytic lesion. Again seen is 
vertebral body plana of T7. Degenerative and postsurgical changes are partially 
included of the lumbar spine.
IMPRESSION: No discrete acute cardiopulmonary findings. 
Stable sternal lytic lesion. 
RADIATION DOSE REDUCTION: All CT scans are performed using radiation dose 
reduction techniques, when applicable.  Technical factors are evaluated and 
adjusted to ensure appropriate moderation of exposure.  Automated dose 
management technology is applied to adjust the radiation doses to minimize 
exposure while achieving diagnostic quality images.
